# Patient Record
Sex: Female | Born: 1950 | Race: White | Hispanic: No | Smoking: Never smoker
Health system: Southern US, Academic
[De-identification: ages and names within clinical notes are randomized; demographics above are authoritative.]

## PROBLEM LIST (undated history)

## (undated) DIAGNOSIS — R413 Other amnesia: Secondary | ICD-10-CM

## (undated) DIAGNOSIS — B009 Herpesviral infection, unspecified: Secondary | ICD-10-CM

## (undated) DIAGNOSIS — K219 Gastro-esophageal reflux disease without esophagitis: Secondary | ICD-10-CM

## (undated) DIAGNOSIS — G2581 Restless legs syndrome: Secondary | ICD-10-CM

## (undated) DIAGNOSIS — E559 Vitamin D deficiency, unspecified: Secondary | ICD-10-CM

## (undated) DIAGNOSIS — M810 Age-related osteoporosis without current pathological fracture: Secondary | ICD-10-CM

## (undated) DIAGNOSIS — I10 Essential (primary) hypertension: Secondary | ICD-10-CM

## (undated) DIAGNOSIS — E782 Mixed hyperlipidemia: Secondary | ICD-10-CM

## (undated) HISTORY — DX: Restless legs syndrome: G25.81

## (undated) HISTORY — DX: Gastro-esophageal reflux disease without esophagitis: K21.9

## (undated) HISTORY — DX: Mixed hyperlipidemia: E78.2

## (undated) HISTORY — DX: Herpesviral infection, unspecified: B00.9

## (undated) HISTORY — DX: Age-related osteoporosis without current pathological fracture: M81.0

## (undated) HISTORY — DX: Essential (primary) hypertension: I10

## (undated) HISTORY — DX: Other amnesia: R41.3

## (undated) HISTORY — DX: Vitamin D deficiency, unspecified: E55.9

---

## 1998-02-07 ENCOUNTER — Other Ambulatory Visit (HOSPITAL_COMMUNITY): Payer: Self-pay

## 2008-06-07 IMAGING — MG MAMMO SCREEN W CAD
1 series · 4 of 4 positions shown · non-contrast
Comparison: 06/03/07 and 09/02/05.

Bilateral Digital Screening Mammography with Computer Assisted Diagnosis:
HISTORY: Asymptomatic 57-year-old with no family history of breast cancer in first degree relatives.

[Series 2: R CC · right · 4 of 4 slices shown]
[im 1/4]
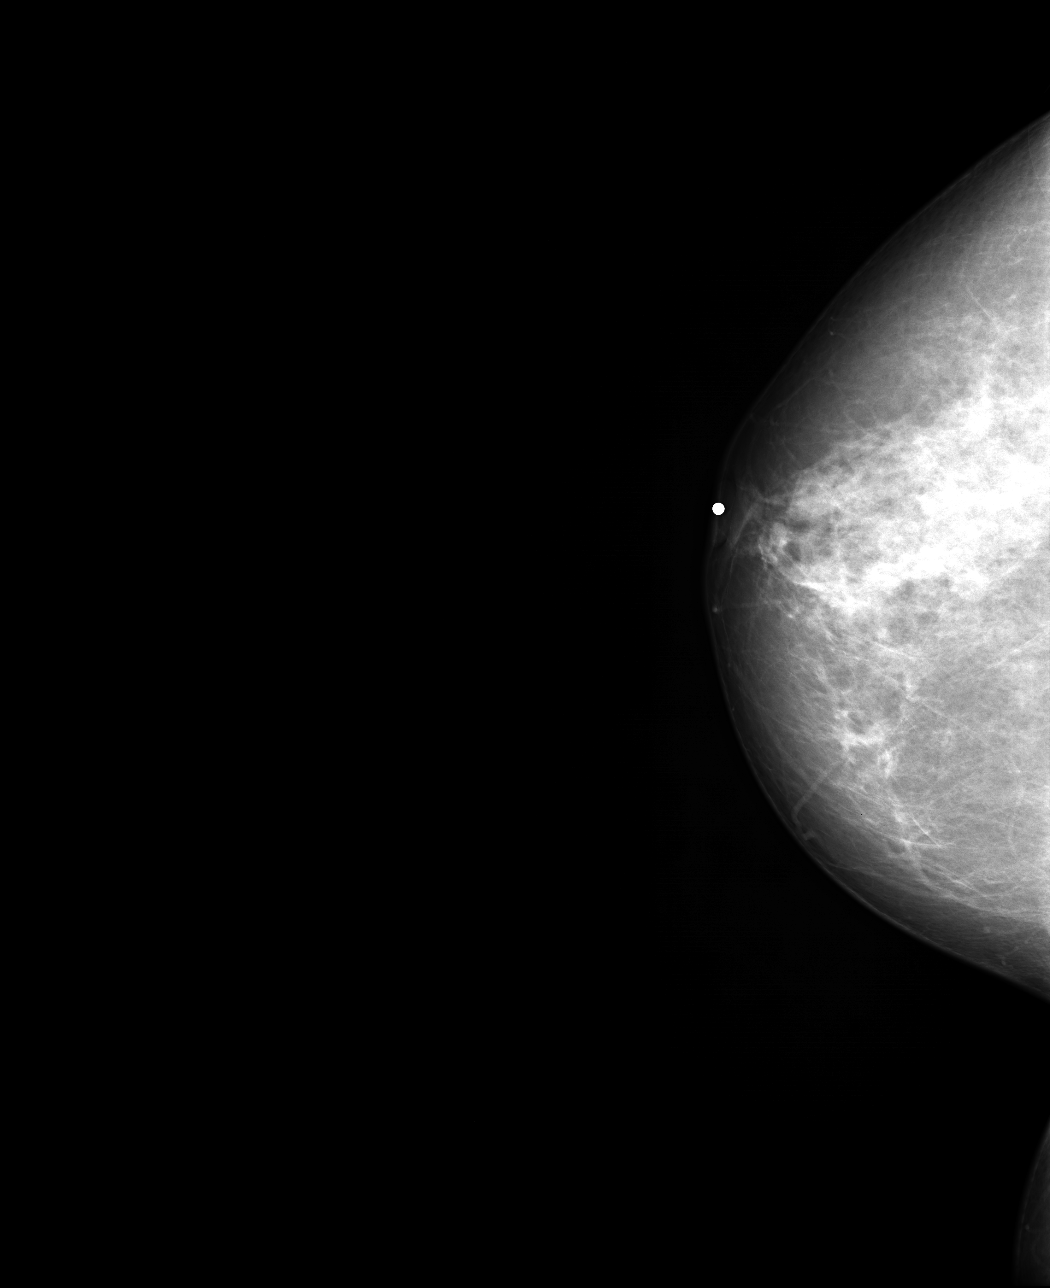
[im 2/4]
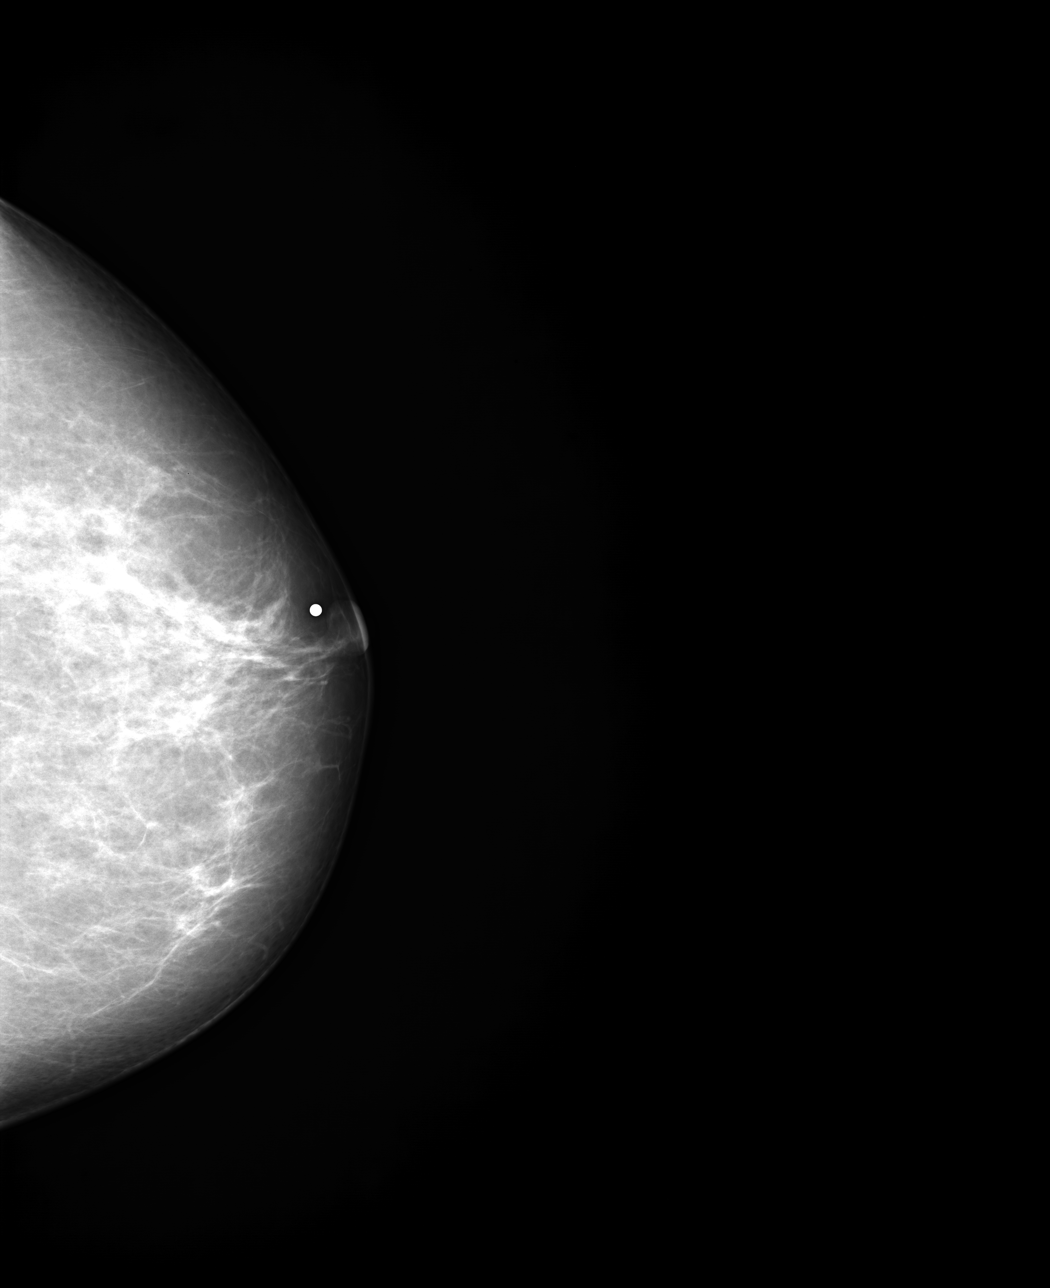
[im 3/4]
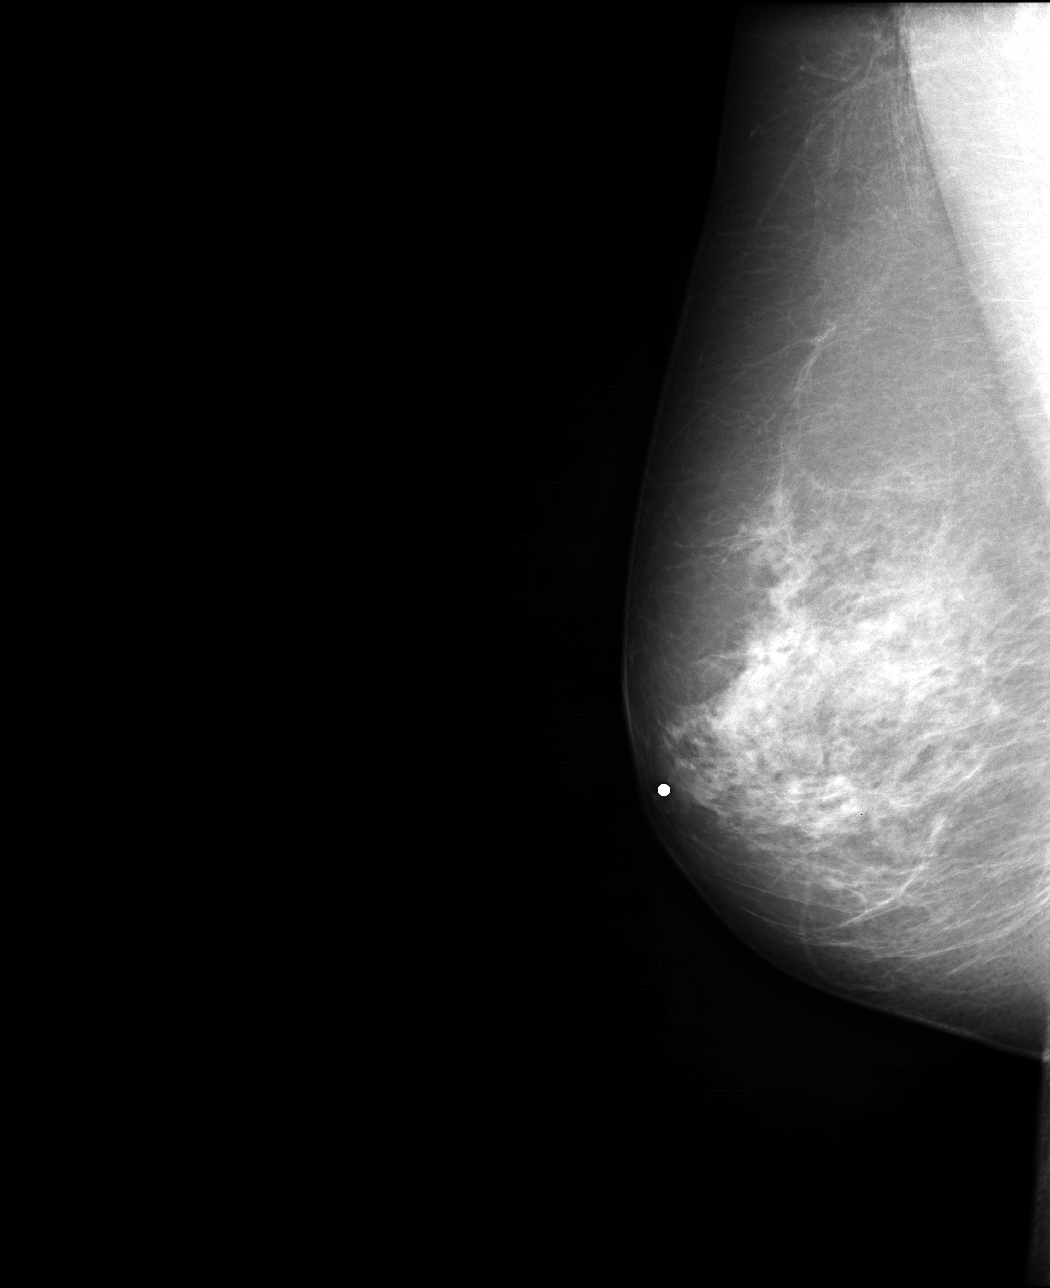
[im 4/4]
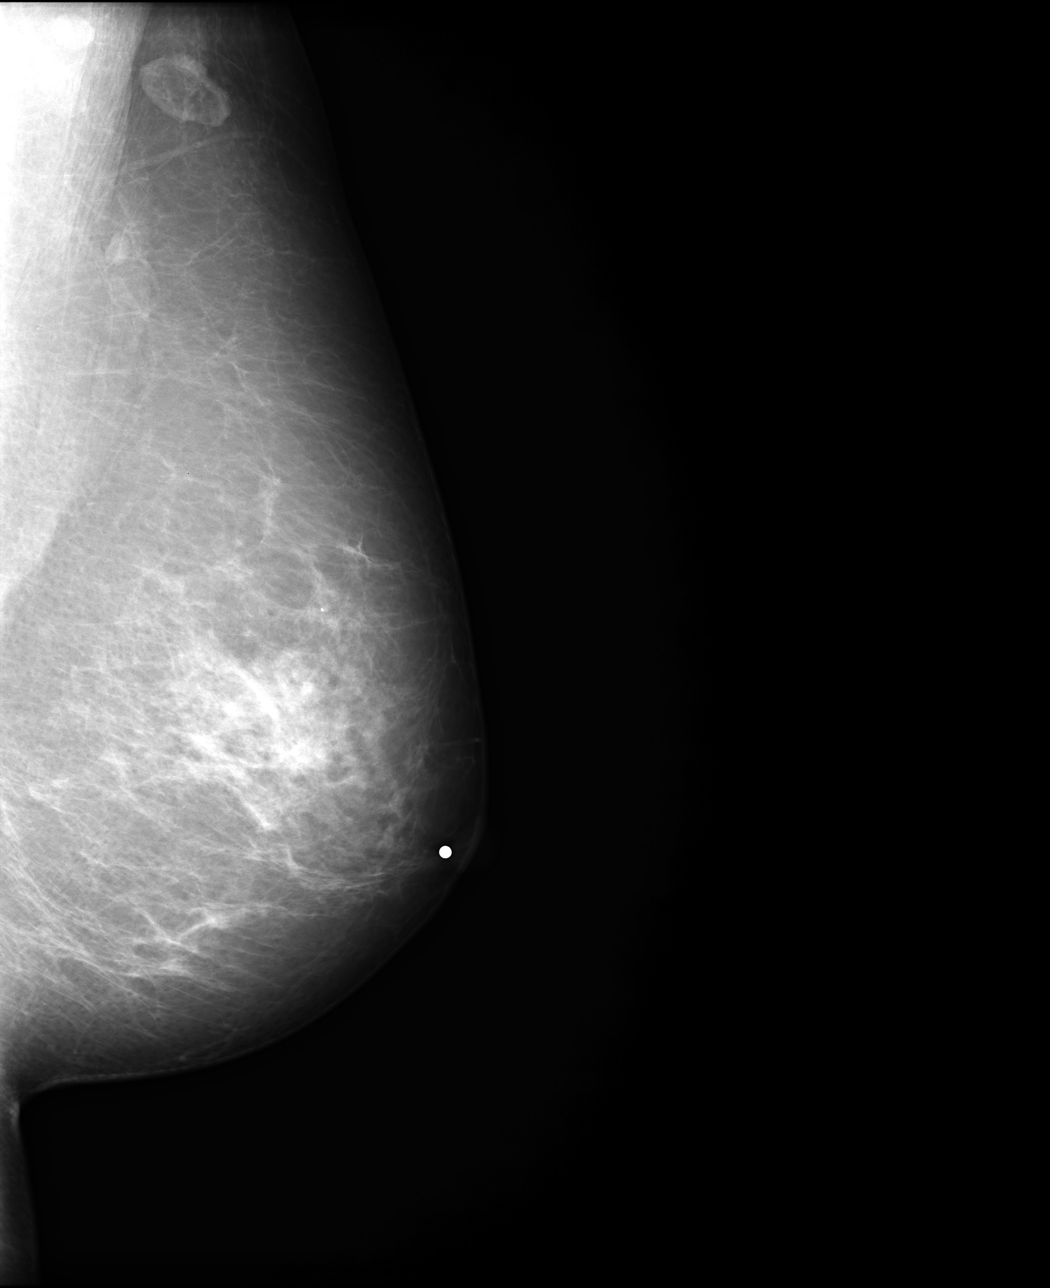

[4 of 4 positions shown; findings below may reference images not displayed]

FINDINGS: Bilaterally dense breasts limits sensitivity to the mammogram.  Mammographic findings, nevertheless are stable without masses or architectural changes.  Benign appearing lymph node in the axillary fatty hilum is noted on the left side.

NOTE:
In compliance with Federal regulations, the results of this mammogram are being sent to the patient.
IMPRESSION: Dense breasts limit sensitivity to the mammogram.  Mammographic findings are stable, however, from 06/03/07.  Please correlate with clinical breast exam findings.  Clinical follow-up and mammographic followup at 12 months are recommended.  

Final Assessment Code:  BI-RADS 2:

BI-RADS 0
Need additional imaging evaluation

BI-RADS 1
Negative mammogram

BI-RADS 2
Benign finding

BI-RADS 3
Probably benign finding - short interval follow-up suggested

BI-RADS 4
Suspicious abnormality: biopsy should be considered

BI-RADS 5
Highly suggestive of malignancy; appropriate action should be taken

________________________________

## 2008-06-09 IMAGING — US GYN
1 series · 14 of 16 positions shown · non-contrast
Comparison: none

HISTORY: Bilateral pelvic pain.

[Series 1: gyn · 0.33mm/px · 14 of 59 slices shown]
[im 1/59]
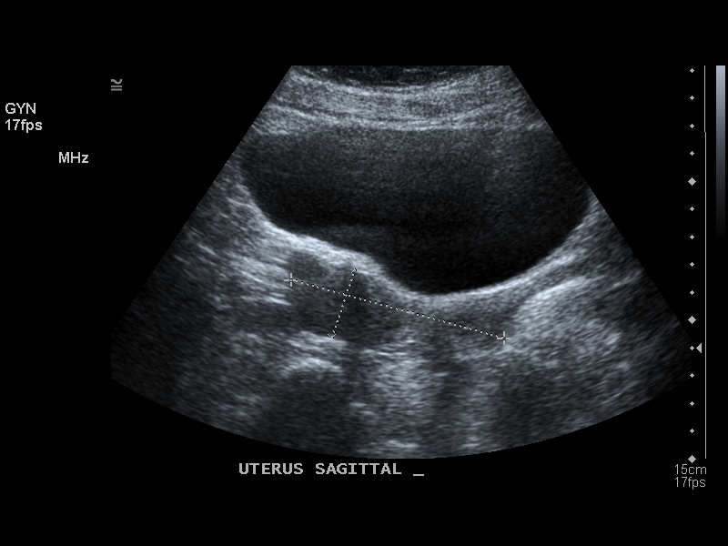
[im 4/59]
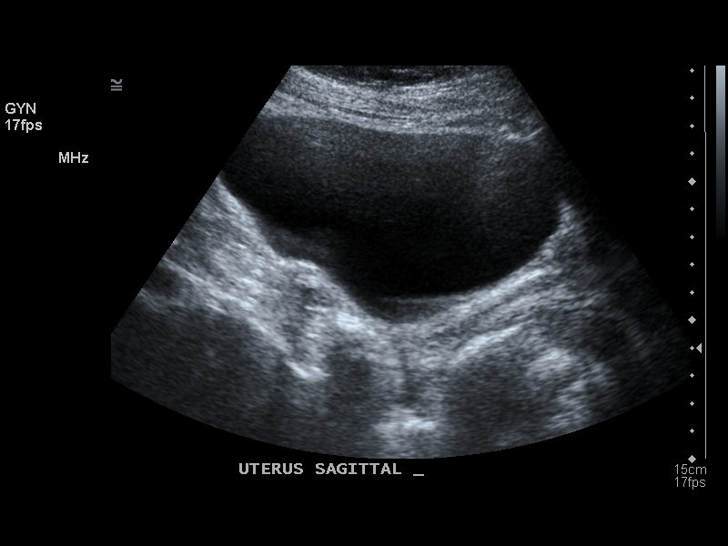
[im 8/59]
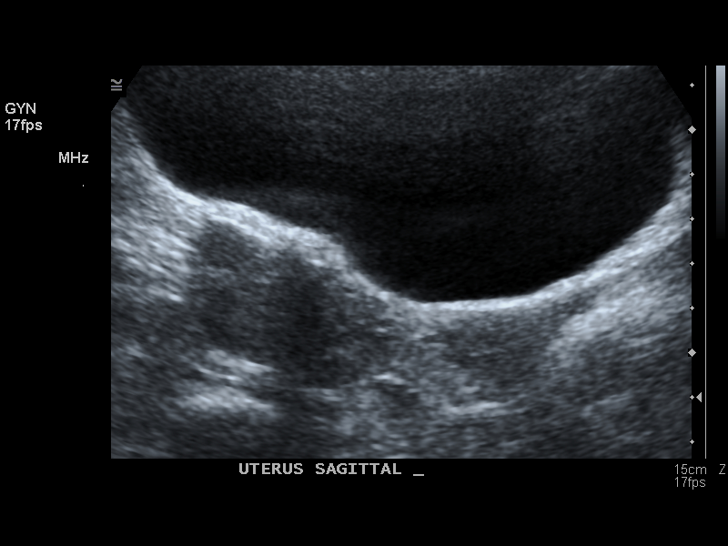
[im 16/59]
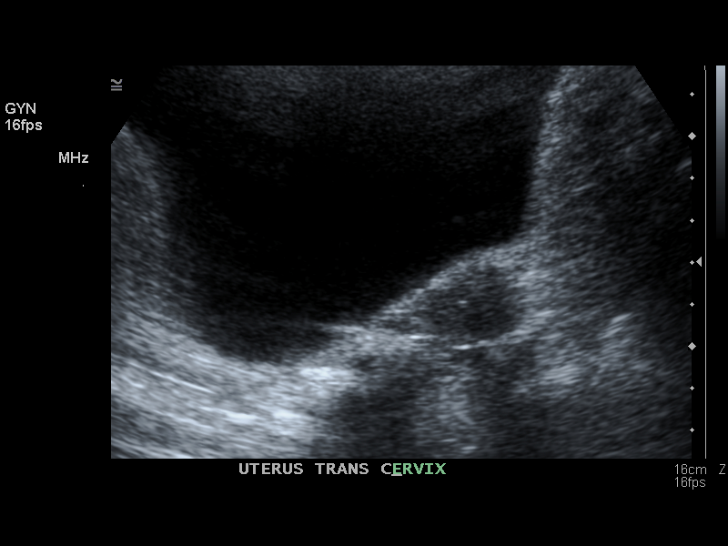
[im 20/59]
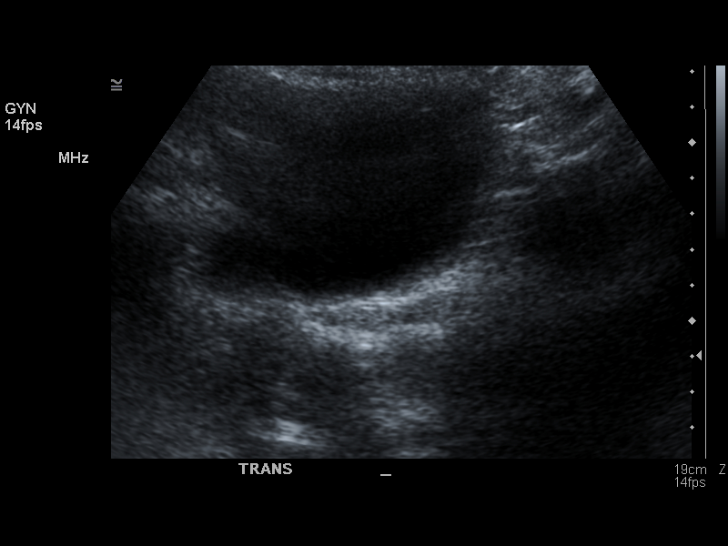
[im 24/59]
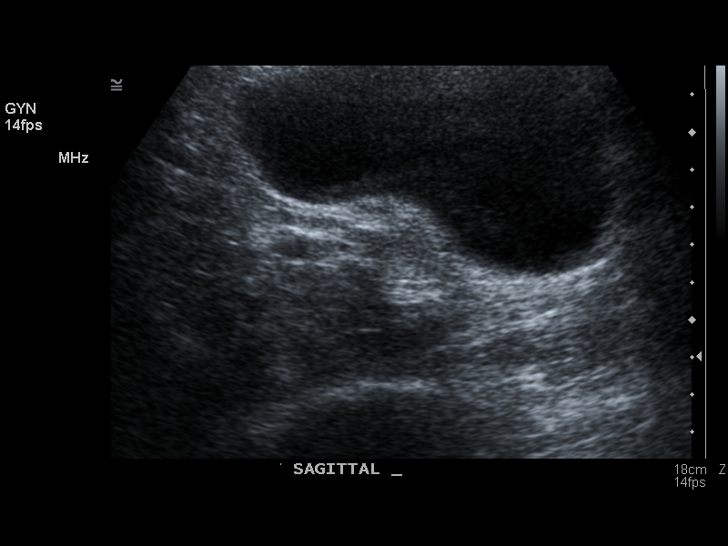
[im 28/59]
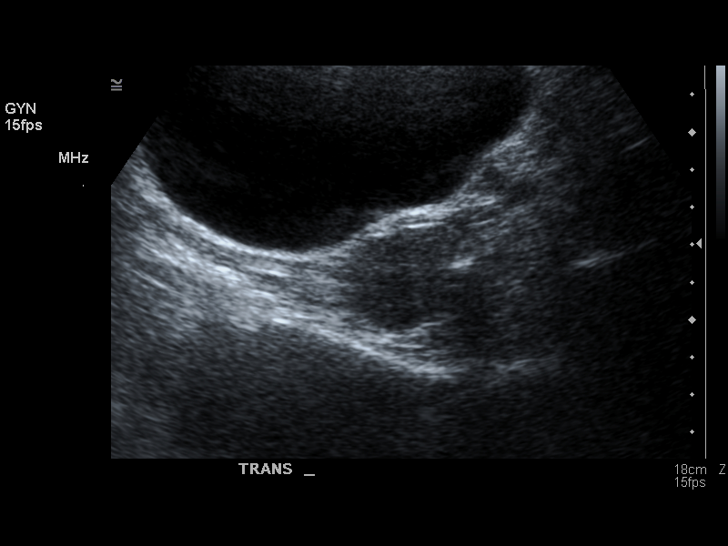
[im 31/59]
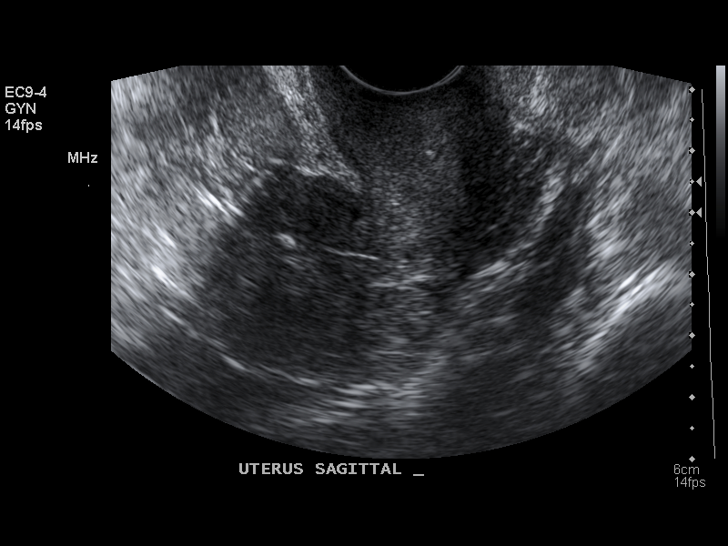
[im 35/59]
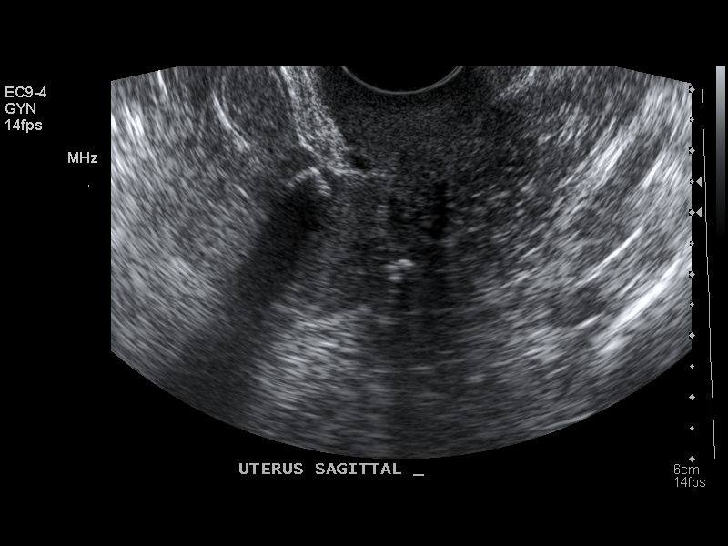
[im 39/59]
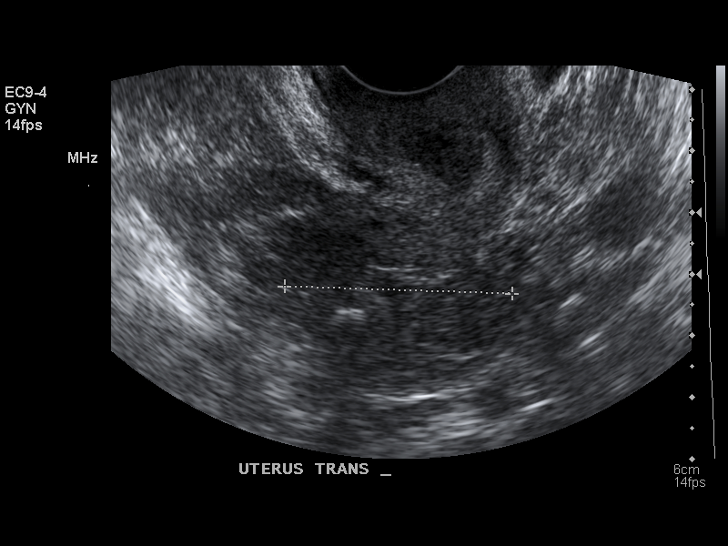
[im 47/59]
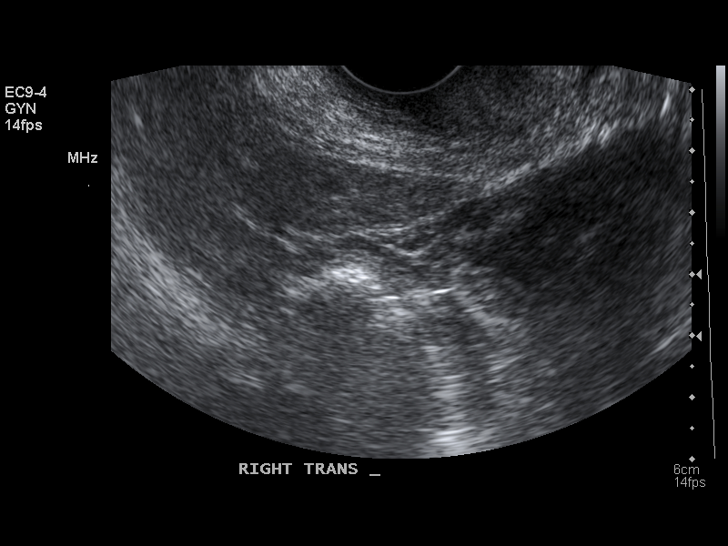
[im 51/59]
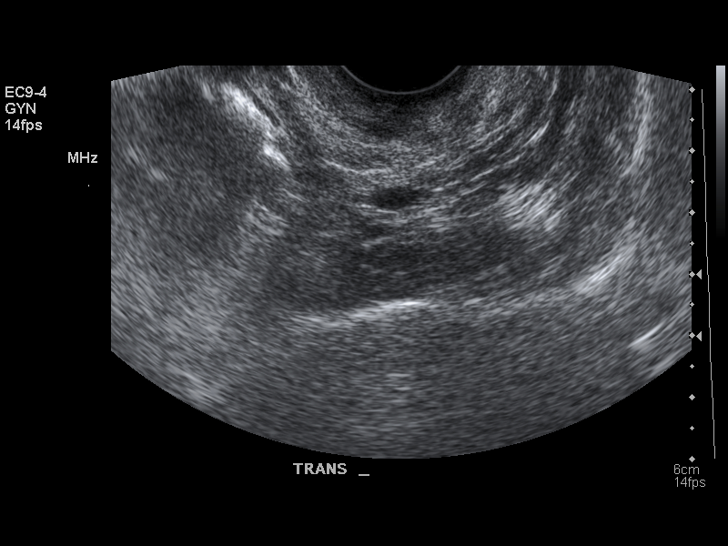
[im 55/59]
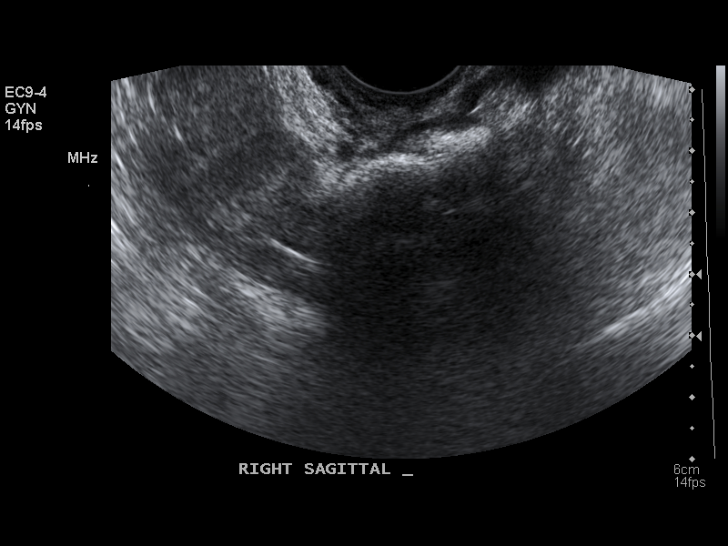
[im 59/59]
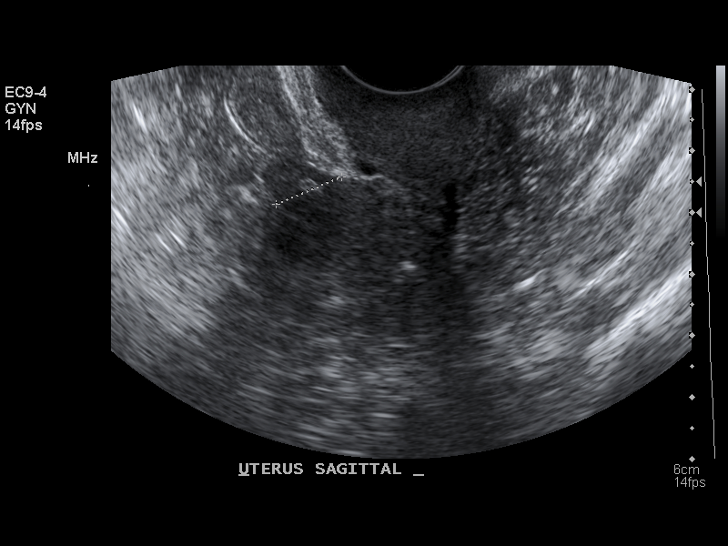

[14 of 16 positions shown; findings below may reference images not displayed]

FINDINGS: A transabdominal and transvaginal examination was performed.

The uterus appears normal in overall size.  There is a 2-cm fibroid in the uterine fundus.  The endometrium appears within normal limits.  Multiple small uterine calcifications are noted.

Neither ovary is well visualized.  There are no adnexal masses or free pelvic fluid.
IMPRESSION: Small uterine fibroid.

________________________________

## 2009-06-08 IMAGING — MG MAMMO SCREEN W CAD
1 series · 4 of 4 positions shown · non-contrast
Comparison: 06/03/07 and 06/07/08.

EXAM:
BILATERAL DIGITAL SCREENING MAMMOGRAM WITH COMPUTER ASSISTED DIAGNOSIS
HISTORY: Asymptomatic 58-year-old with no family history of breast cancer in first degree relatives.

[Series 2: R CC · right · 4 of 4 slices shown]
[im 1/4]
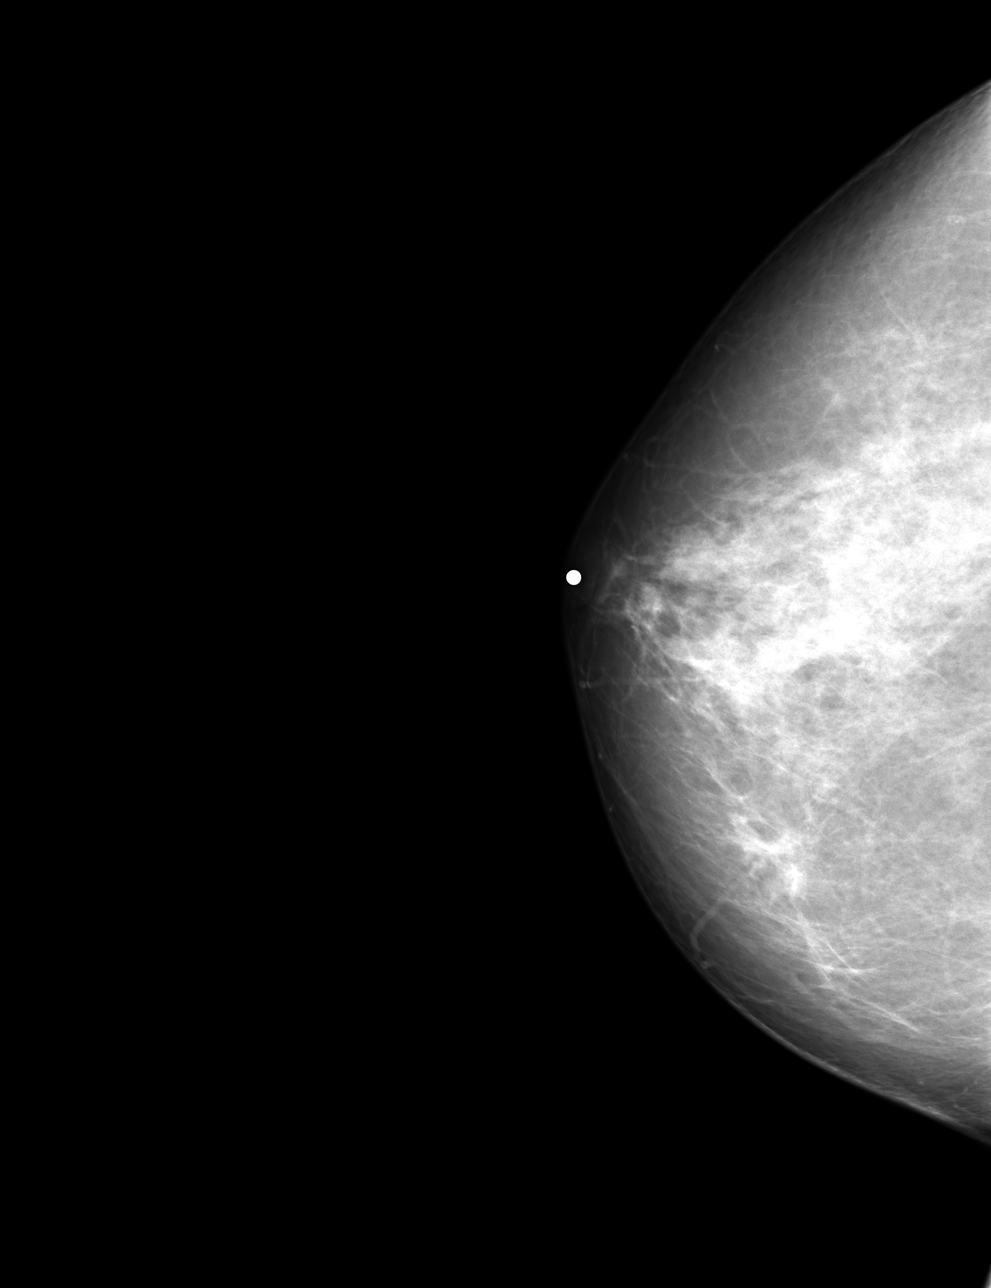
[im 2/4]
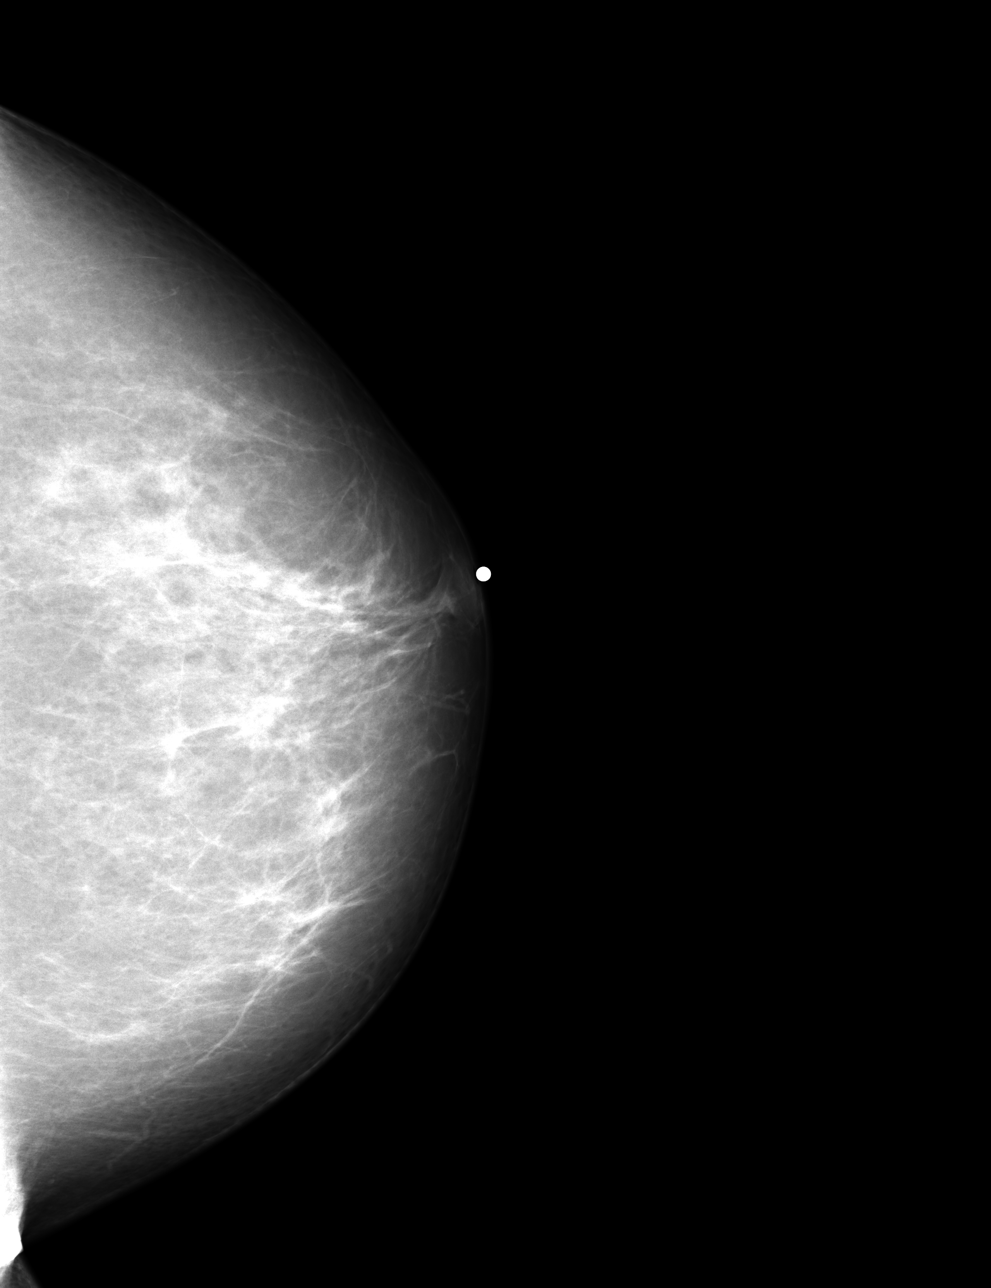
[im 3/4]
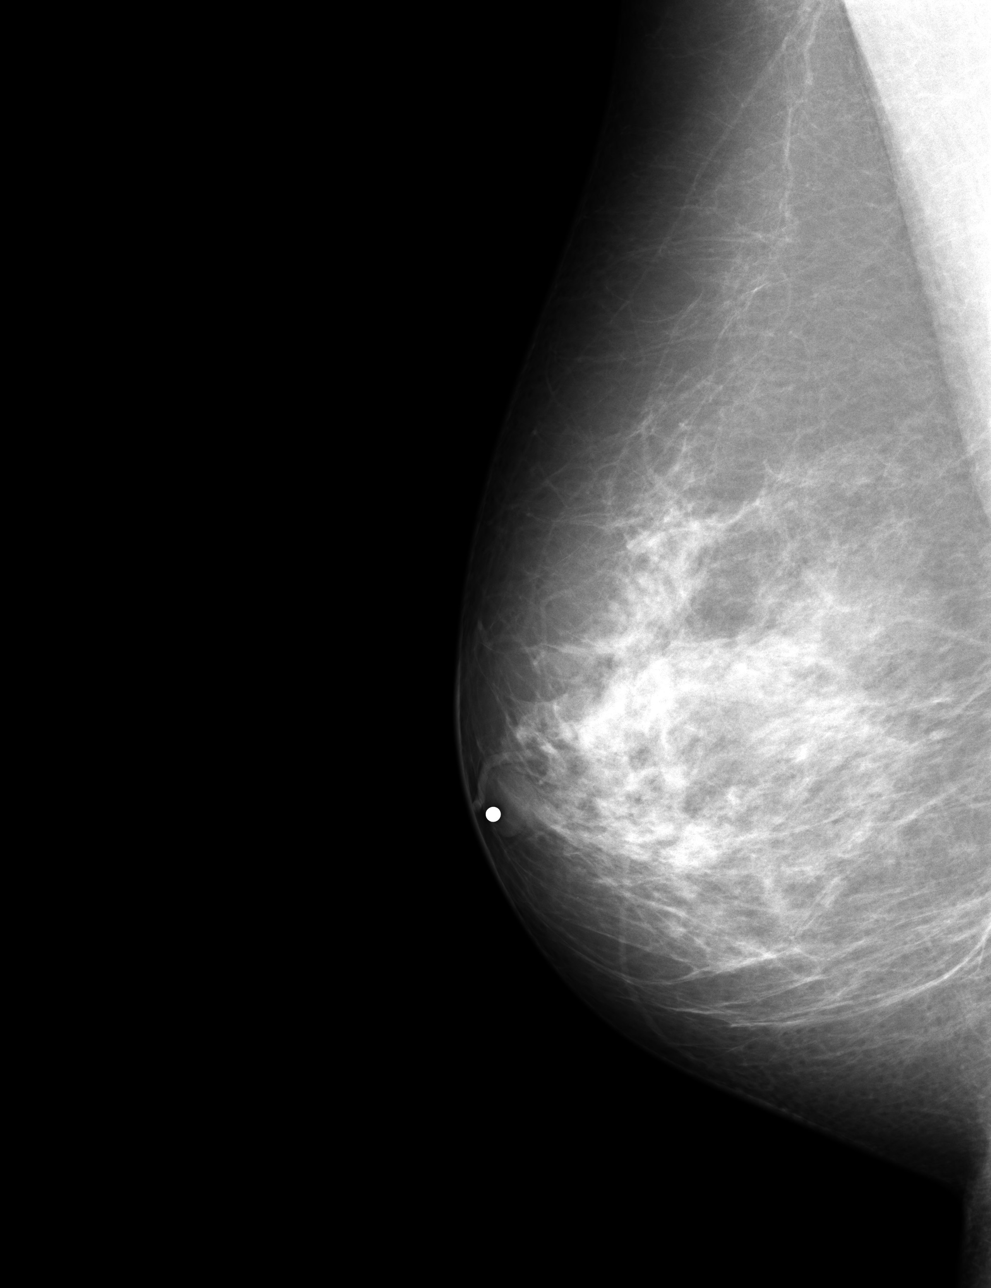
[im 4/4]
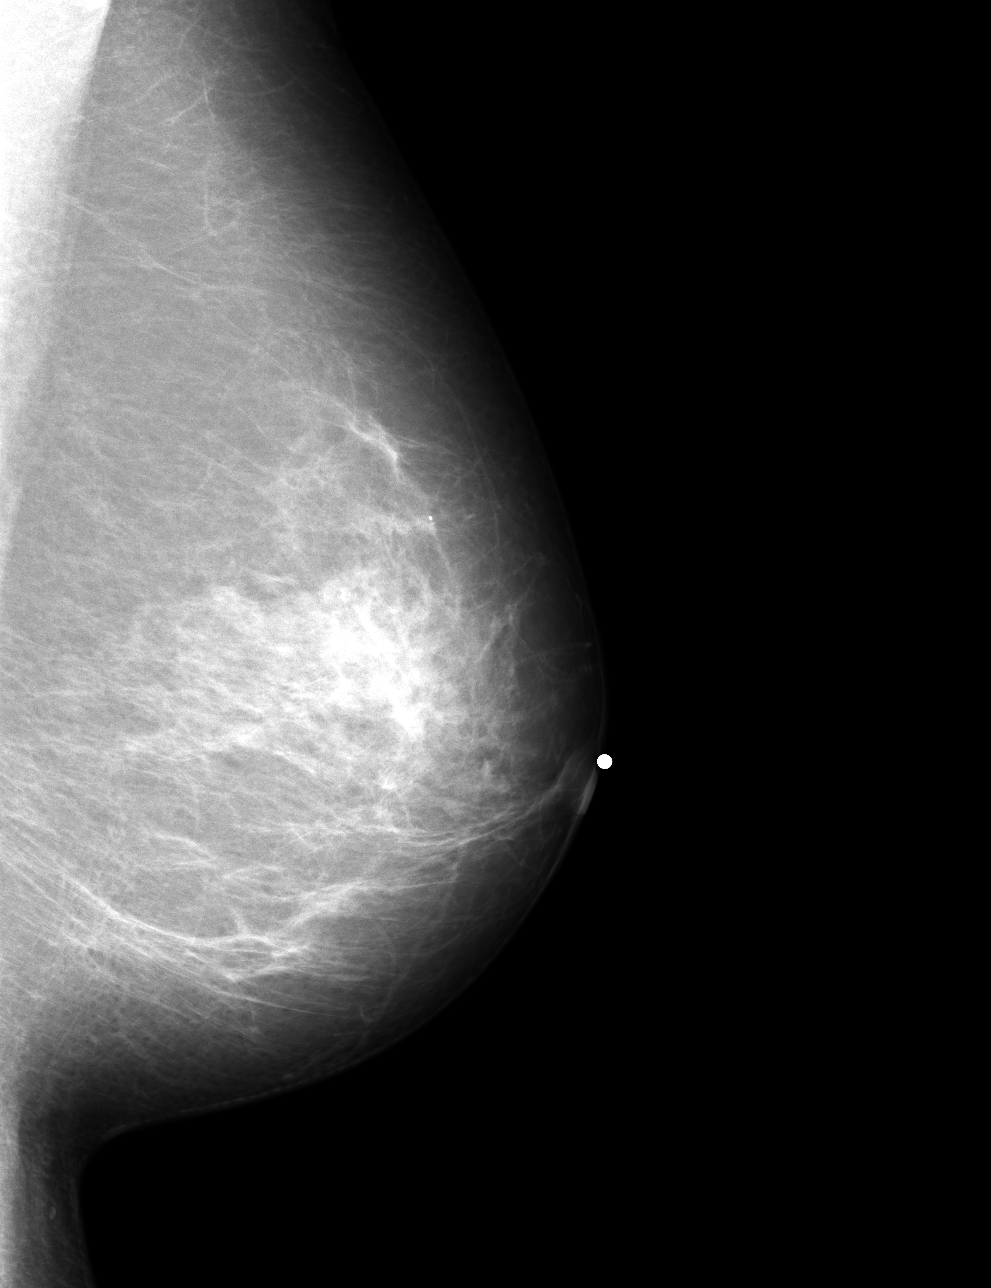

[4 of 4 positions shown; findings below may reference images not displayed]

FINDINGS: No well-defined masses or architectural changes are seen. Global asymmetry of the right breast compared to the left side is unchanged. No skin changes or nipple changes are seen 

NOTE:

In compliance with Federal regulations, the results of this mammogram are being sent to the patient.
IMPRESSION: Stable mammographic findings including global asymmetry of the right breast compared to the left side. Clinical and mammographic followup are recommended at twelve months. 

BI-RADS Category 2. 

BI-RADS 0
Need additional imaging evaluation
BI-RADS 1
Negative mammogram
BI-RADS 2
Benign finding
BI-RADS 3
Probably benign finding - short interval follow-up suggested
BI-RADS 4
Suspicious abnormality: biopsy should be considered
BI-RADS 5
Highly suggestive of malignancy; appropriate action should be taken

________________________________

## 2010-06-13 IMAGING — MG MAMMO SCREEN W CAD
1 series · 6 of 6 positions shown · non-contrast
Comparison: 04/30/11 and 04/29/10.
COMPARISON: 04/30/11 and 04/29/10.

------------- REPORT [id number] -------------
David Sebastian Lancha, do
INDICATION: Screening
ADDITIONAL HISTORY:
Patient denies any previous breast surgery and denies any current problems with her breasts. Patient denies any family history of breast cancer. 

NATIONAL CANCER RISK ASSESSMENT:
Patient’s five year risk of breast cancer according to the Anton Risk Model is 1.4% and lifetime risk of breast cancer is 8.7%. 
RISK FACTOR:
No positive family history of breast cancer.
TECHNIQUE: Bilateral MLO and CC digital images were obtained. This study was reviewed using iCAD 200 software.
Patient’s five year risk of breast cancer according to the Walter Eduardo Risk Model is 1.4% and lifetime risk of breast cancer is 8.7%. 

[Series 2: R CC · right · 6 of 6 slices shown]
[im 1/6]
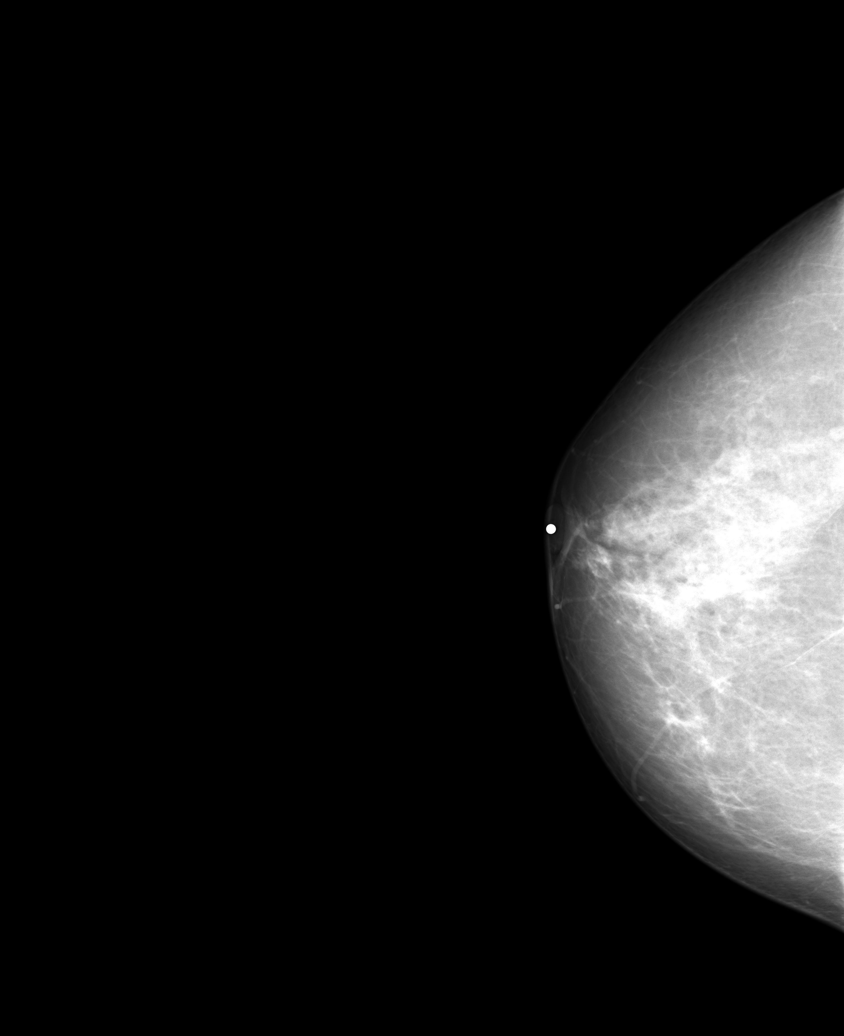
[im 2/6]
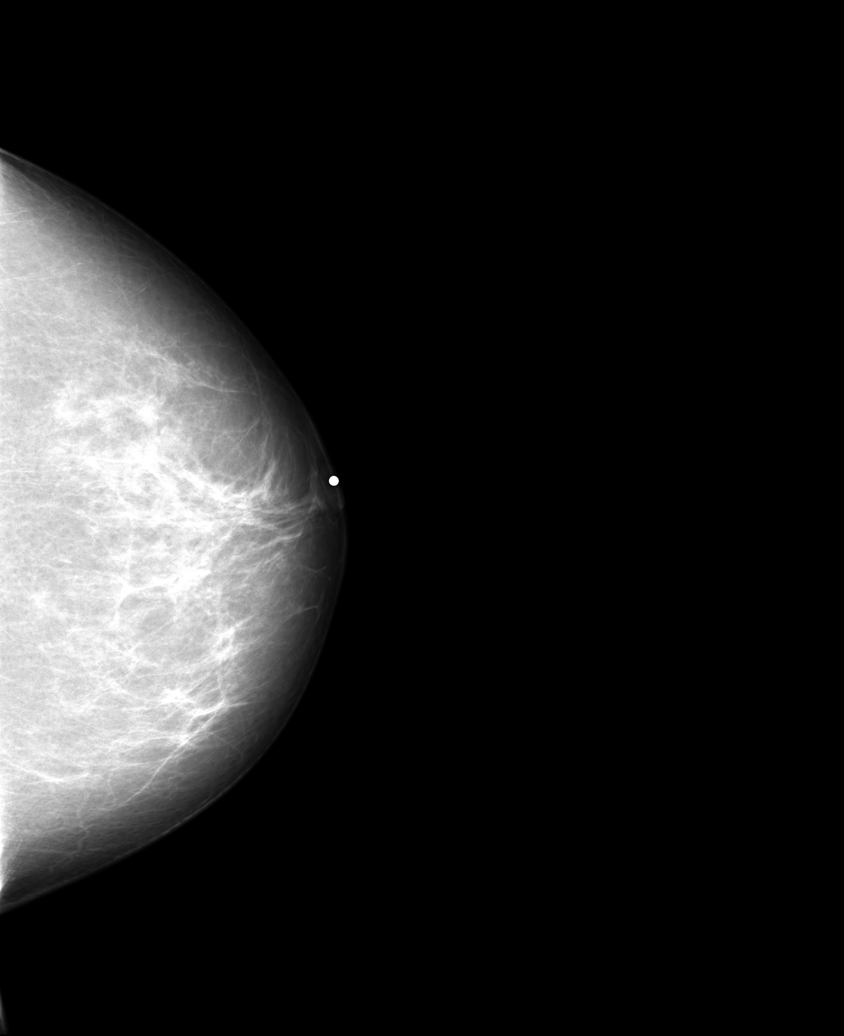
[im 3/6]
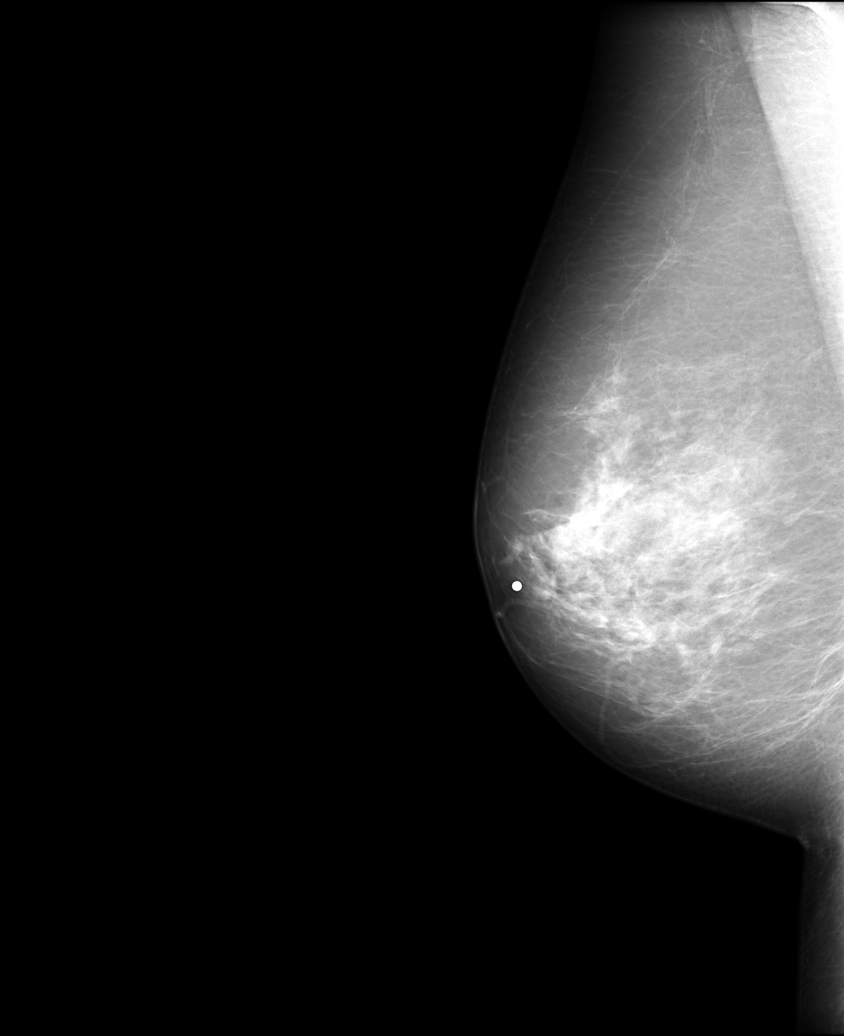
[im 4/6]
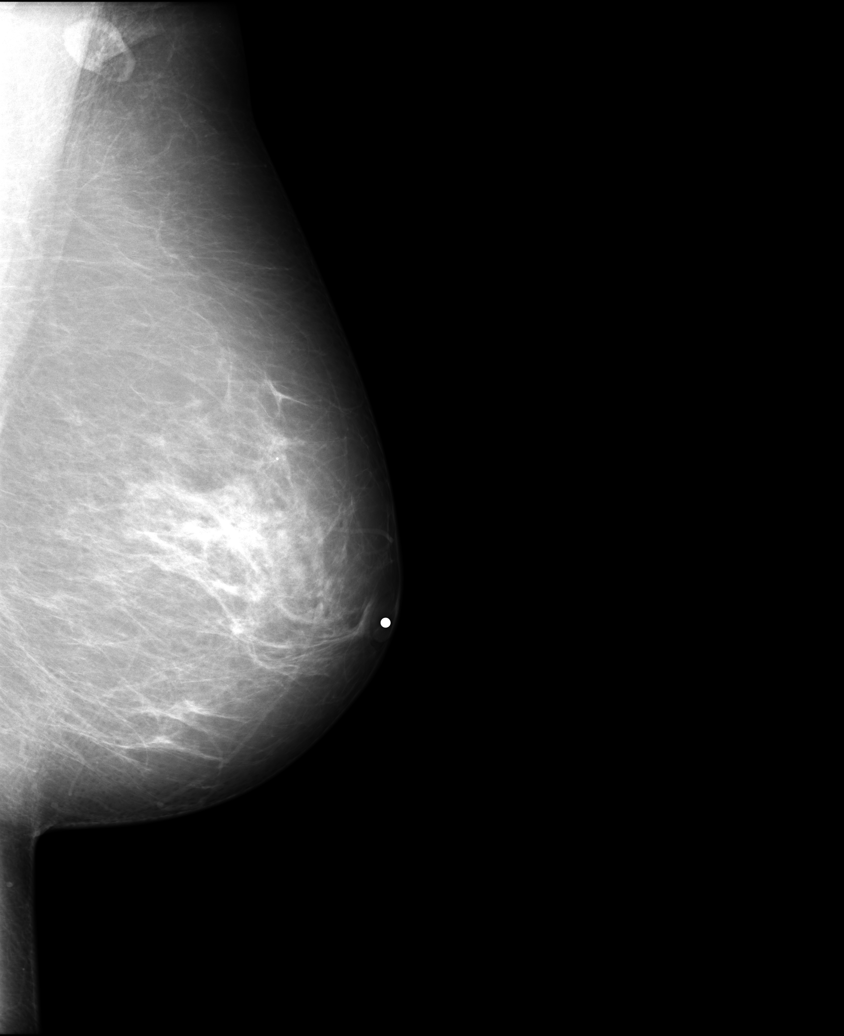
[im 5/6]
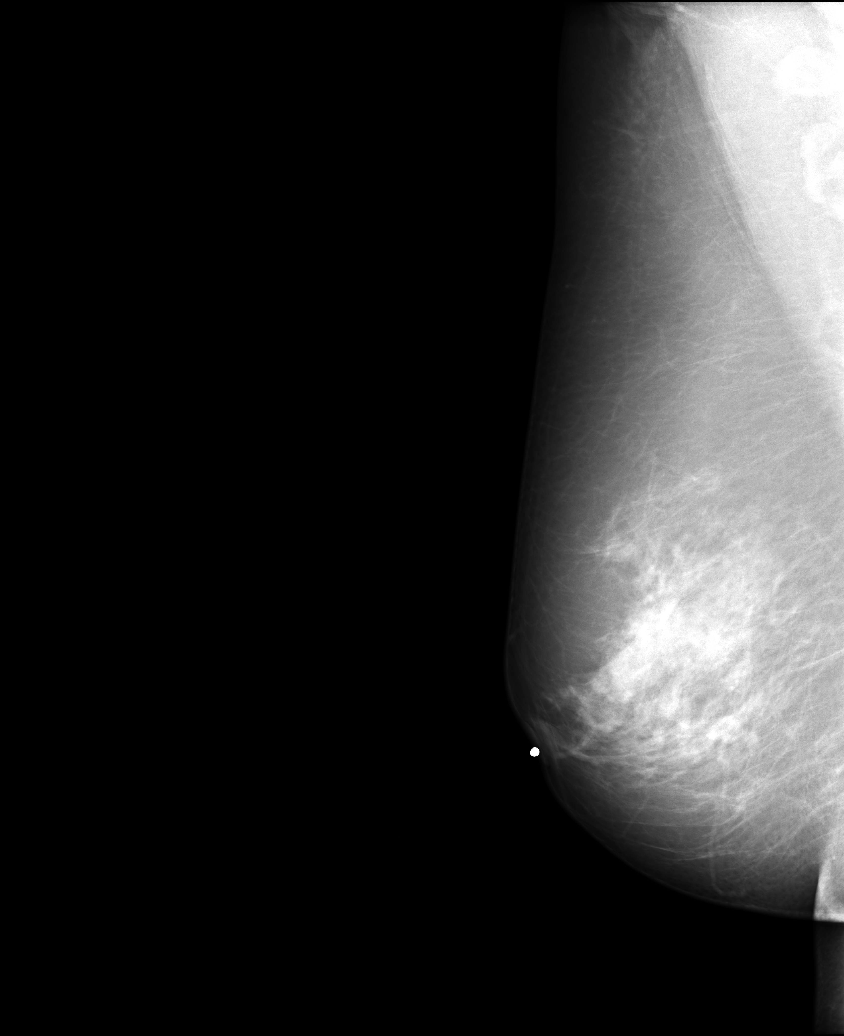
[im 6/6]
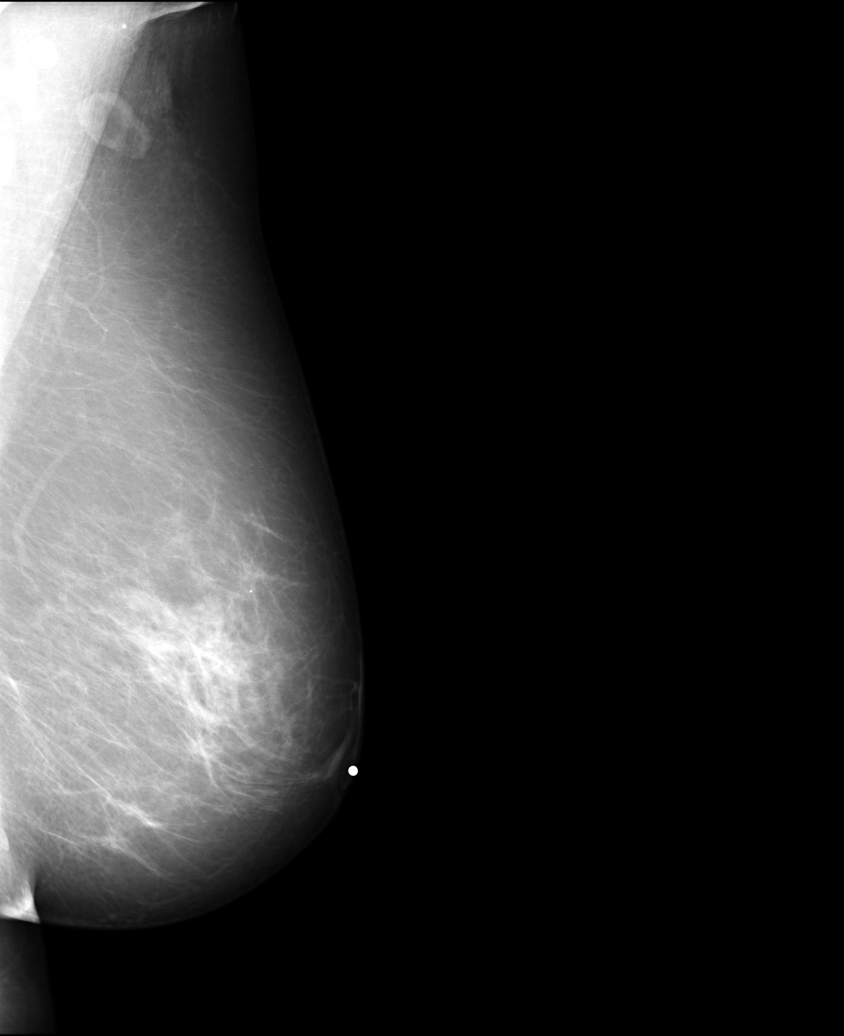

[6 of 6 positions shown; findings below may reference images not displayed]

FINDINGS: On the left CC view, there is an area of increased density on the lateral aspect. There are several punctate calcifications which appear to be present although they are quite faint and difficult to visualize. This area is not seen on previous mammograms.
IMPRESSION: BI-RADS 0

RECOMMENDATION: Left breast spot compression in the CC and MLO views. Left breast ultrasound of the upper outer quadrant is recommended as well. 

FINAL ASSESSMENT CODE:
BI-RADS 0

________________________________

------------- REPORT [id number] -------------
FINDINGS: On the left CC view, there is an area of increased density on the lateral aspect. There are several punctate calcifications which appear to be present although they are quite faint and difficult to visualize. This area is not seen on previous mammograms.
IMPRESSION: BI-RADS 0

RECOMMENDATION: Left breast spot compression in the CC and MLO views. Left breast ultrasound of the upper outer quadrant is recommended as well. 

FINAL ASSESSMENT CODE:
BI-RADS 0

________________________________

## 2010-06-19 IMAGING — MG MAMMO UNI LT DIAGNOSTIC W CAD
1 series · 2 of 2 positions shown · non-contrast
Comparison: 06/13/10 and 06/08/09 and 06/07/08.

Dig Mammo CB add views, Left Breast Ultrasound

Exam:
Digital Diagnostic Mammogram Left with CAD and Left Breast Ultrasound
INDICATION: Abnormal screening mammogram.

[Series 2: L CC · left · 2 of 2 slices shown]
[im 1/2]
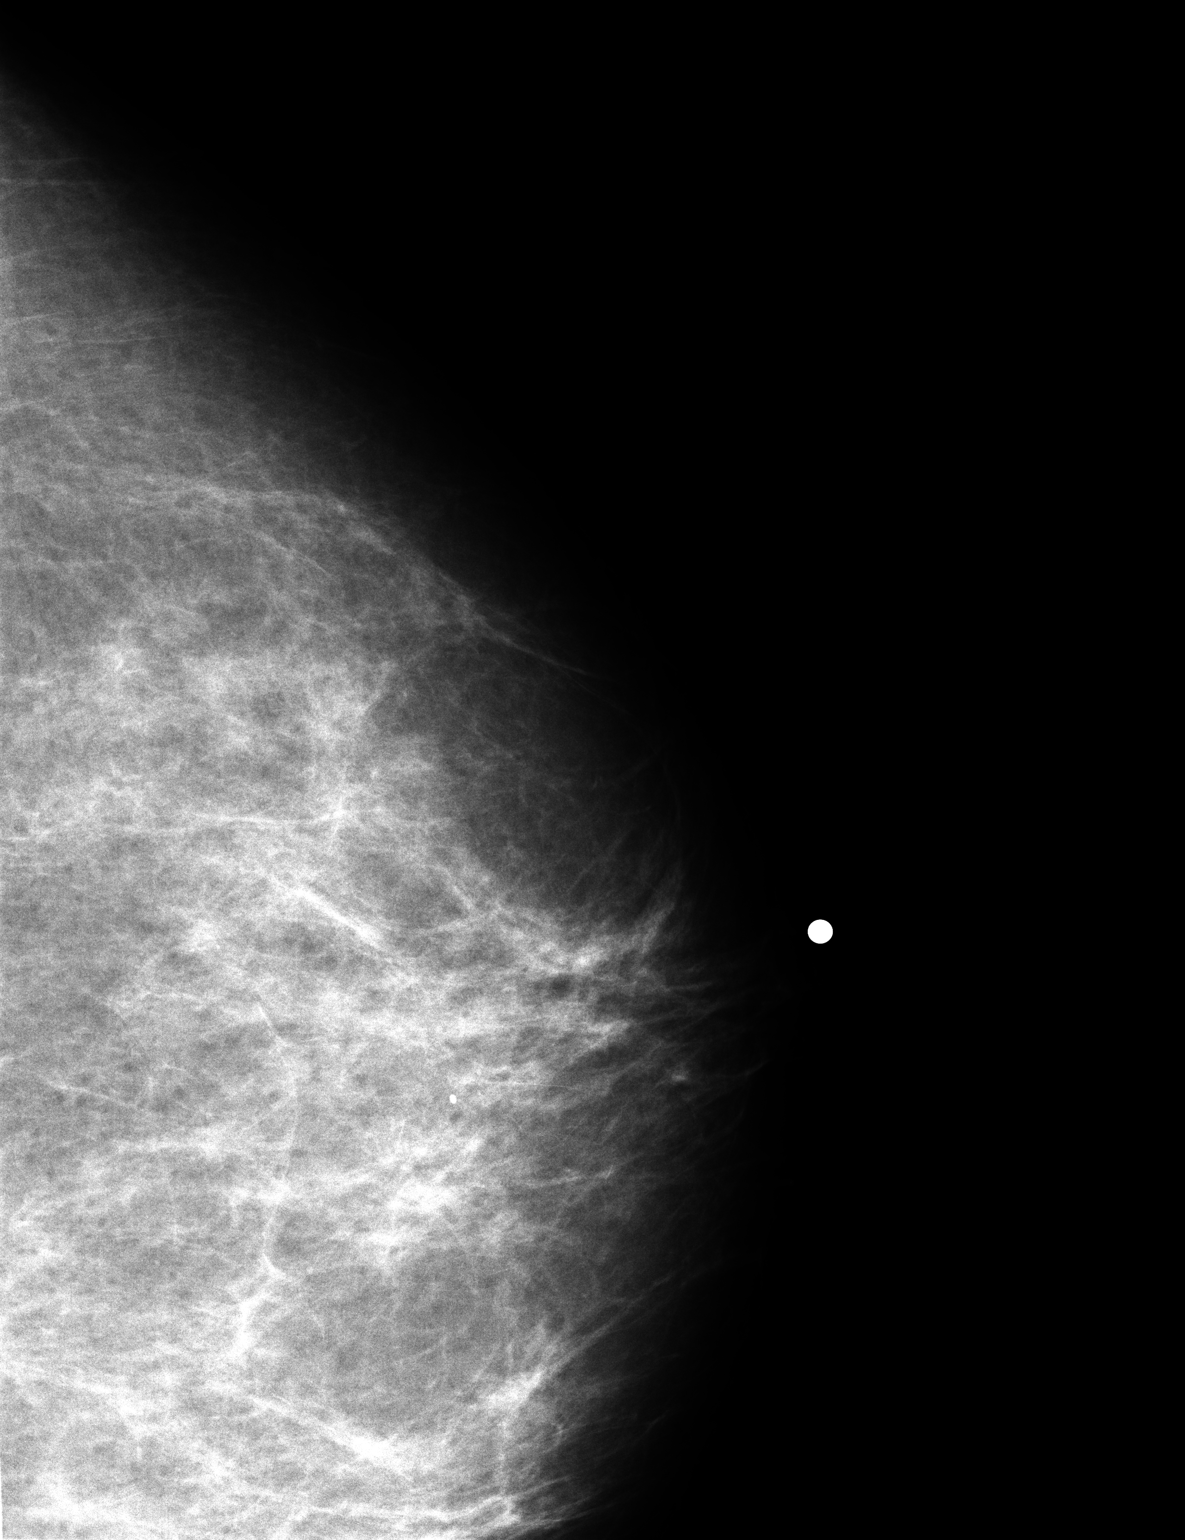
[im 2/2]
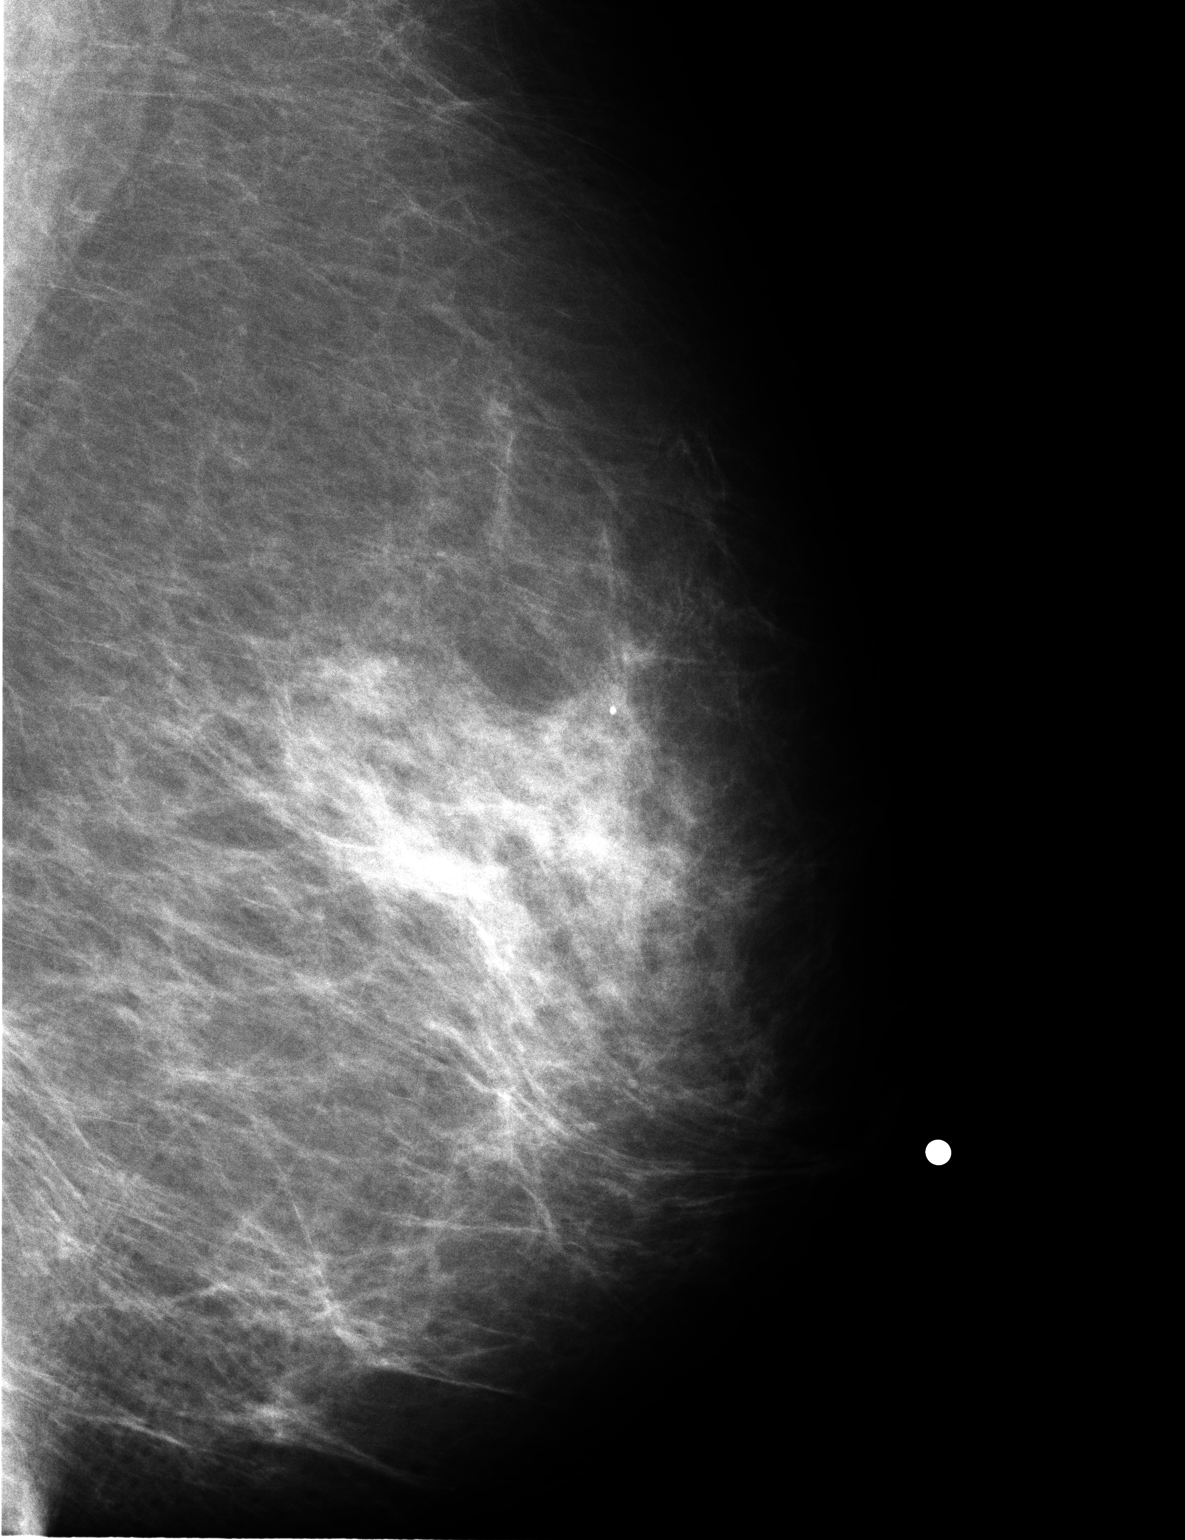

[2 of 2 positions shown; findings below may reference images not displayed]

FINDINGS: Spot compression magnification of the left breast demonstrates an area of question on the screening mammogram to efface in a normal fashion. No calcifications are appreciated at that location. Ultrasound imaging of the upper outer quadrant of the left breast is unremarkable. The axilla on the left demonstrates a normal appearing lymph node with fatty hilum. 

NOTE:

In compliance with Federal regulations, the results of this mammogram are being sent to the patient.
IMPRESSION: Bi-Rads 2. 
RECOMMENDATION: Annual screening per ACR guidelines and monthly self breast examination. 

Final Assessment Code:
Bi-Rads 2 

BI-RADS 0
Need additional imaging evaluation
BI-RADS 1
Negative mammogram
BI-RADS 2
Benign finding
BI-RADS 3
Probably benign finding: short-interval follow-up suggested
BI-RADS 4
Suspicious abnormality:  biopsy should be considered
BI-RADS 5
Highly suggestive of malignancy; appropriate action should be taken

________________________________

## 2011-06-20 IMAGING — MG MAMMO SCREEN W CAD
1 series · 4 of 4 positions shown · non-contrast
Comparison: 06/13/10 and 06/08/09.

INDICATION: Screening

ADDITIONAL HISTORY:
Patient denies previous breast surgery. She denies any current problems with her breasts. 
RISK FACTOR:
No positive family history of breast cancer.
TECHNIQUE: Bilateral MLO and CC digital images were obtained. This study was reviewed using iCAD 200 software.

[Series 2: R CC · right · 4 of 4 slices shown]
[im 1/4]
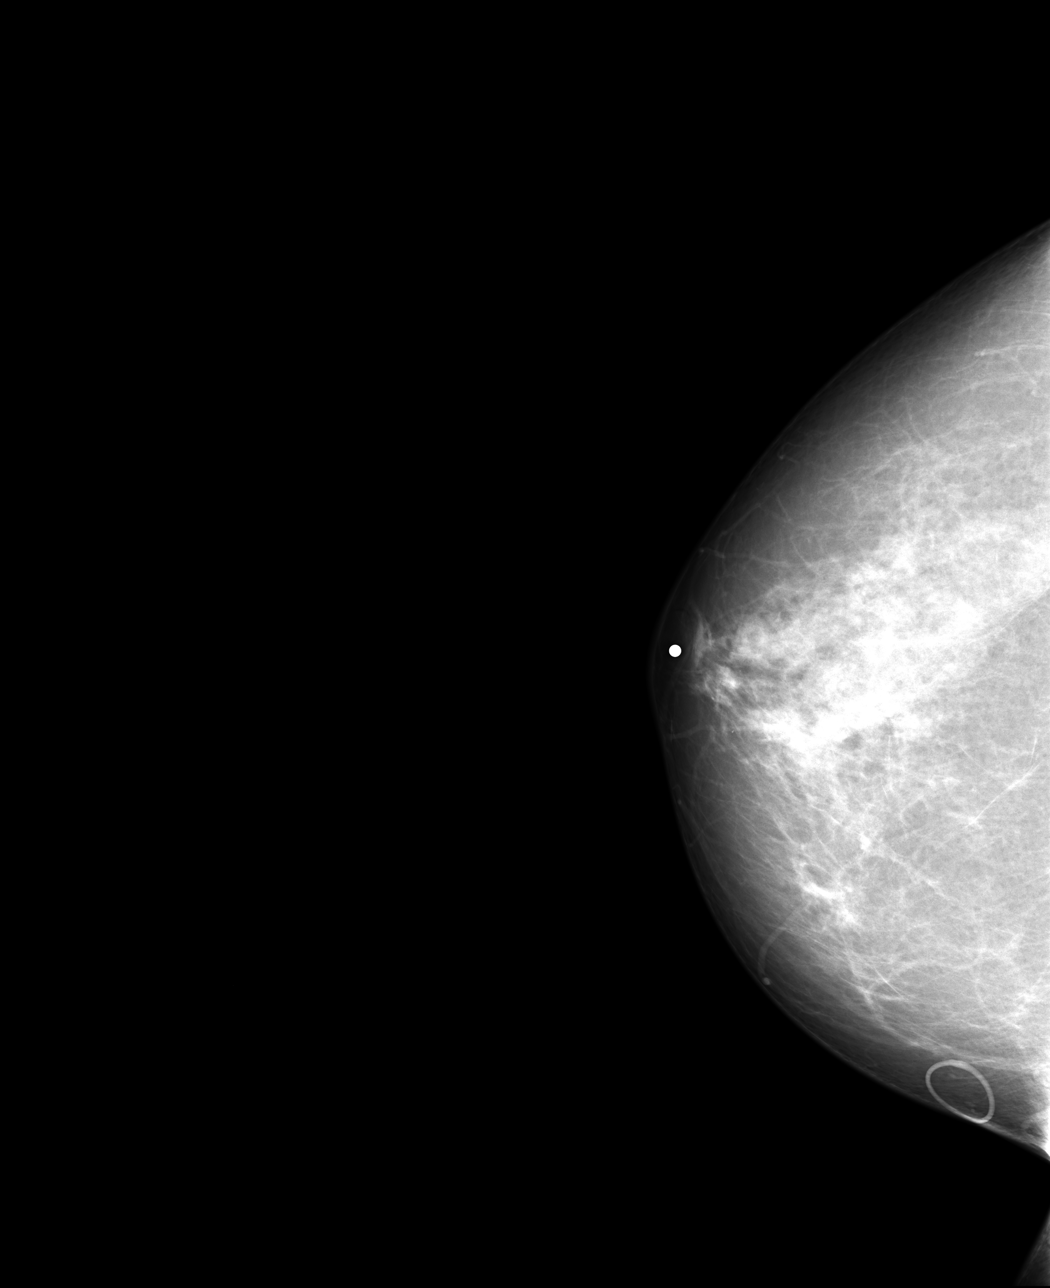
[im 2/4]
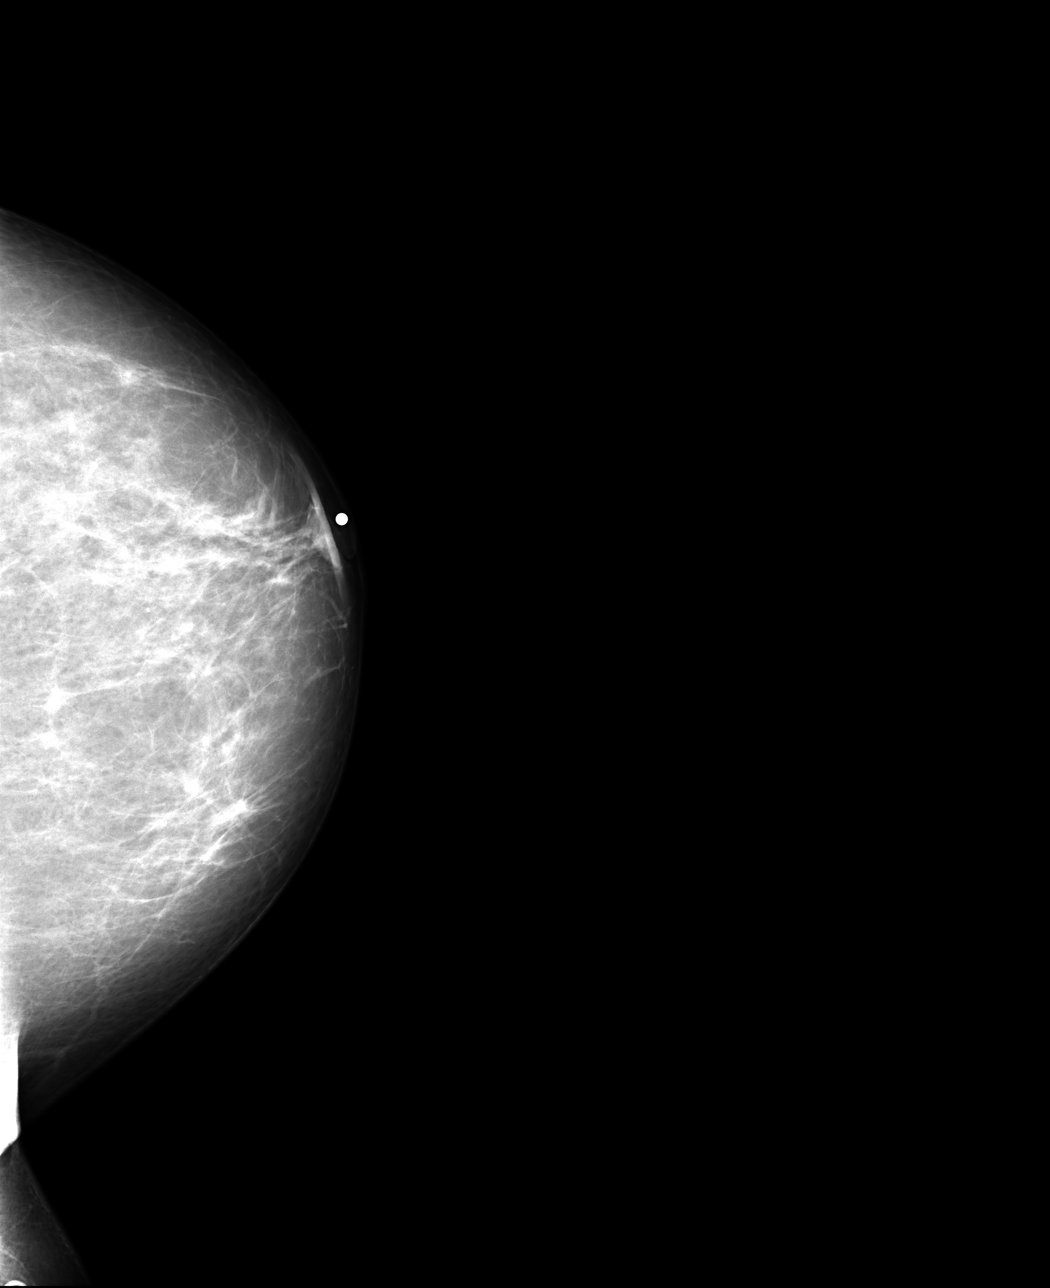
[im 3/4]
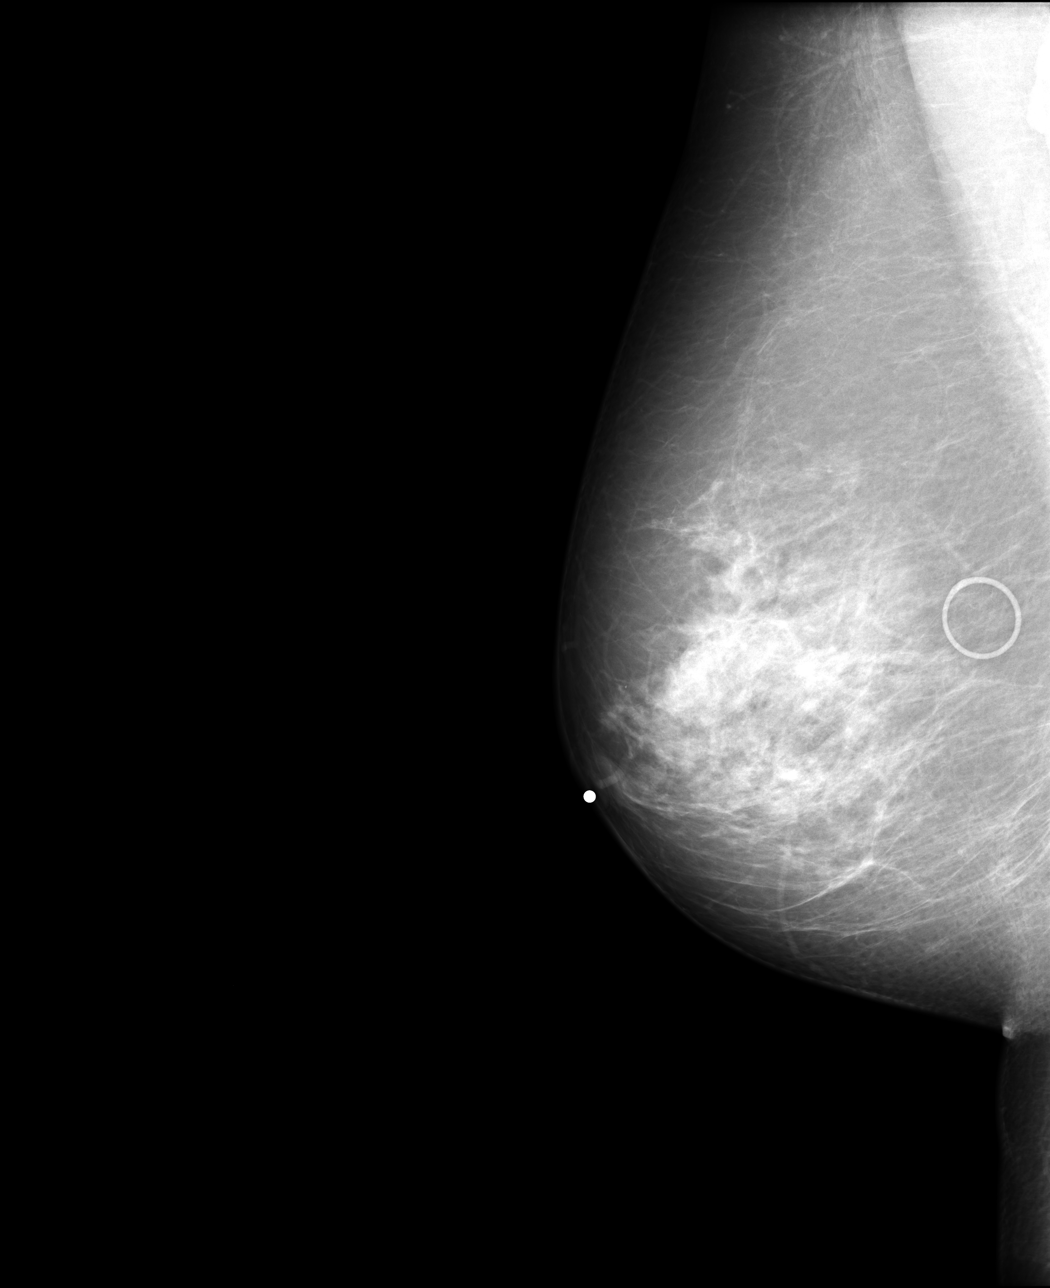
[im 4/4]
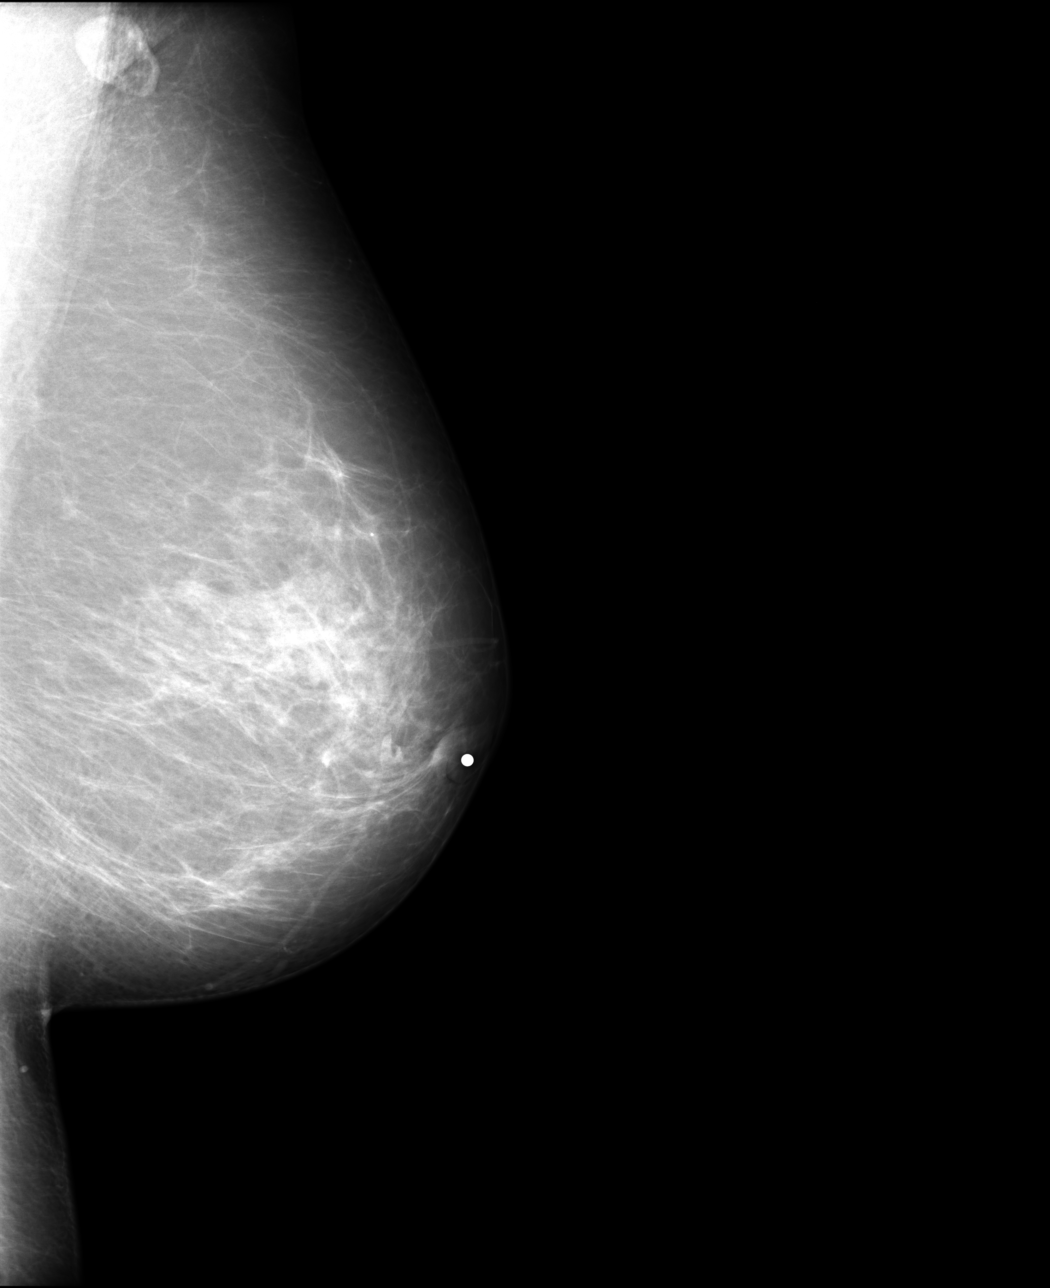

[4 of 4 positions shown; findings below may reference images not displayed]

FINDINGS: The breasts have scattered fibroglandular density. There is no skin thickening, or nipple retraction noted. No suspicious masses, microcalcifications or architectural distortion are identified.
IMPRESSION: BI-RADS 1

RECOMMENDATION: Annual screening per ACR guidelines and monthly self breast examination. 

FINAL ASSESSMENT CODE:

BI-RADS 1

________________________________

[DATE] [DATE]

## 2012-06-25 IMAGING — MG MAMMO SCREEN W CAD
1 series · 4 of 4 positions shown · non-contrast
Comparison: Mammography from June 2011.

Yashita Nonato, do

Exam:
Screening digital mammogram with CAD
INDICATION: Annual.

[Series 2: R CC · right · 4 of 4 slices shown]
[im 1/4]
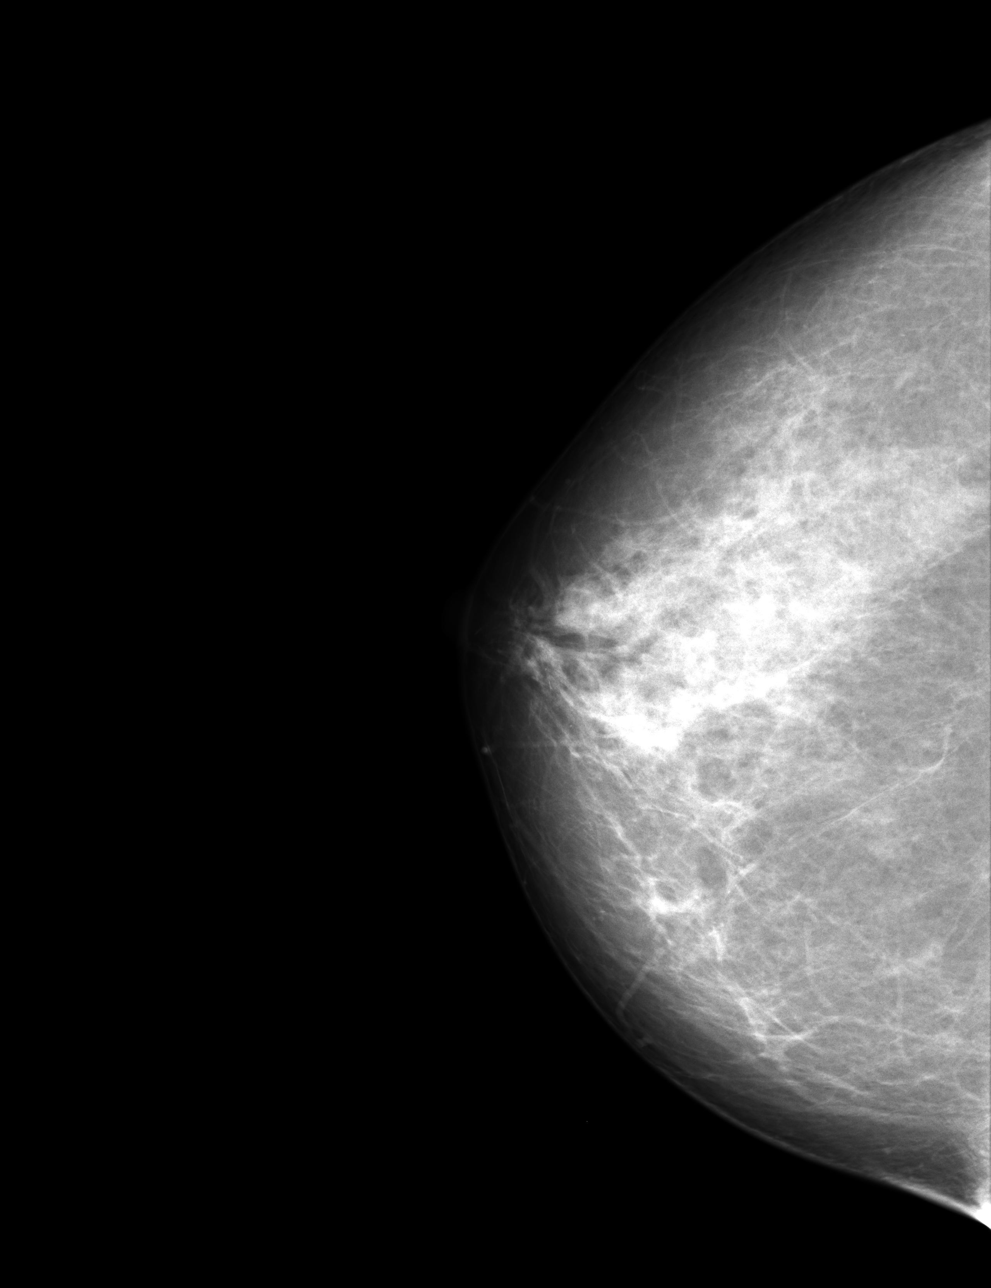
[im 2/4]
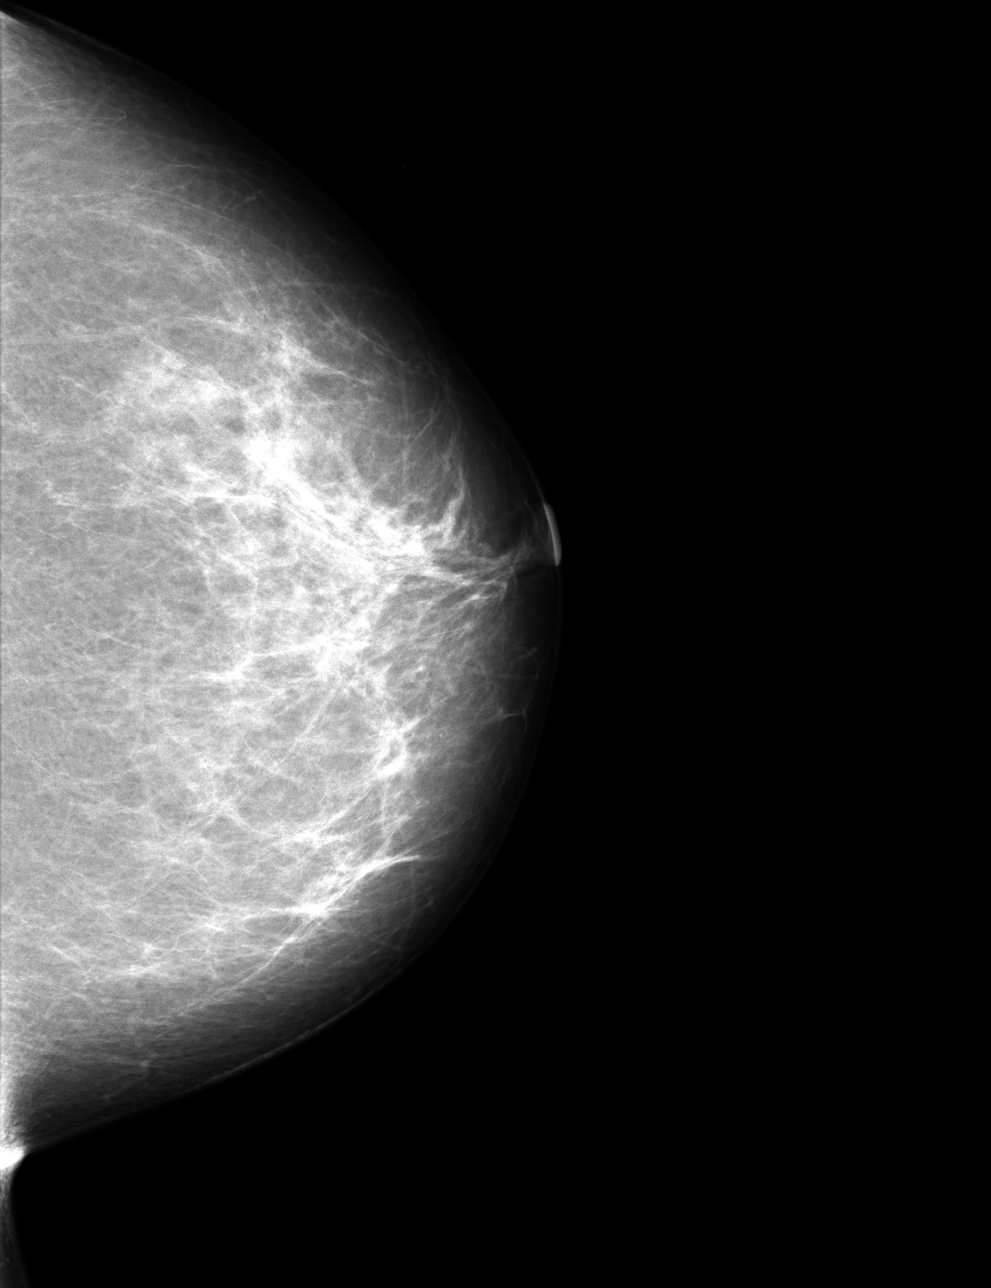
[im 3/4]
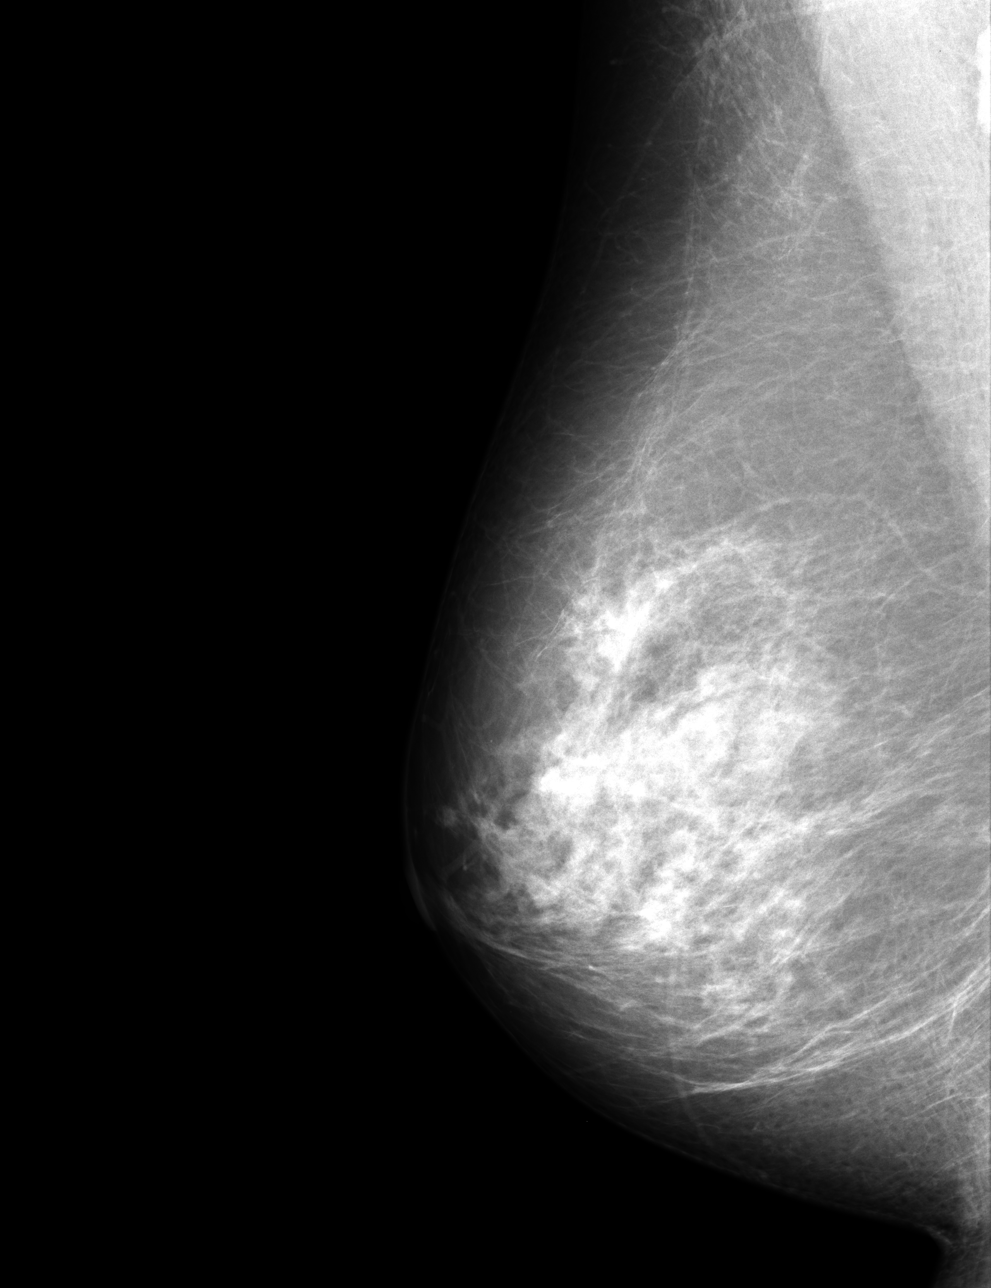
[im 4/4]
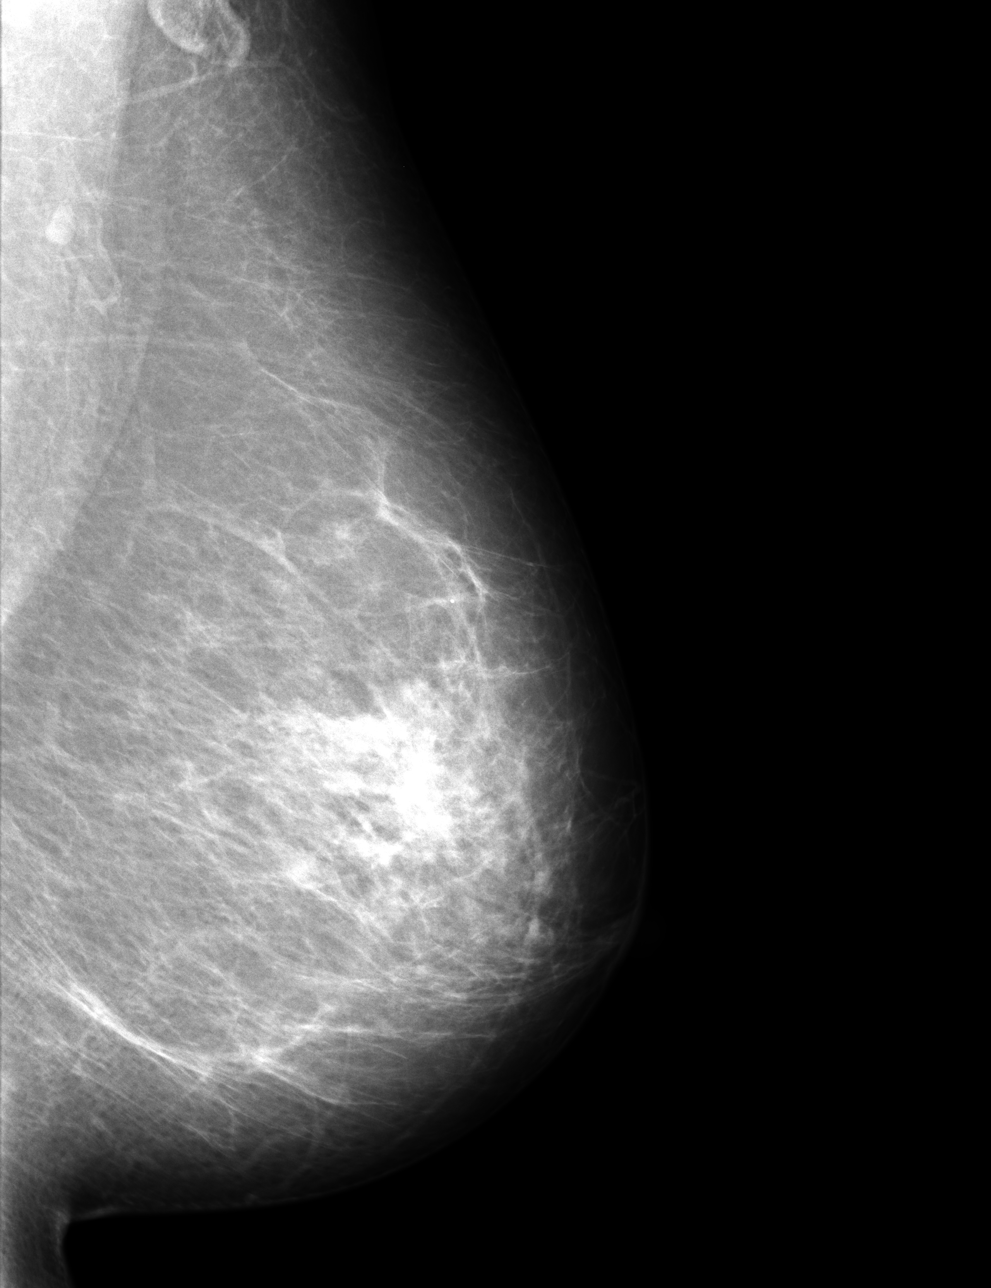

[4 of 4 positions shown; findings below may reference images not displayed]

FINDINGS: Breasts contain scattered fibroglandular elements. No new masses or asymmetries are seen. No new skin thickening, nipple retraction or calcification identified.
IMPRESSION: Bi-Rads 2-Benign findings. 
RECOMMENDATION: Resume routine annual screening. 
Final Assessment Code:
Bi-Rads 2 

BI-RADS 0
Need additional imaging evaluation
BI-RADS 1
Negative mammogram
BI-RADS 2
Benign finding
BI-RADS 3
Probably benign finding: short-interval follow-up suggested
BI-RADS 4
Suspicious abnormality:  biopsy should be considered
BI-RADS 5
Highly suggestive of malignancy; appropriate action should be taken

NOTE:
In compliance with Federal regulations, the results of this mammogram are being sent to the patient.

________________________________
Jumper, Nya., signed this document electronically

## 2013-07-07 IMAGING — MG MAMMO DIGITAL SCREENING
1 series · 4 of 4 positions shown · non-contrast
Comparison: 

------------- REPORT [id number] -------------
[facility]
[address]
Mor Ms.MIO, STU:
We wish to report the following on your recent mammography examination. We are sending a report to your referring physician or other health care provider. 
(       Normal/Negative:
No evidence of cancer.
This statement is mandated by the [place], Department of Health.
Your examination was performed by one of our technologists, who are registered radiological technologists and also specially certified in mammography:
___
Donjuan, Wavy (M)
Tena, Danielf (M)
Gia, Nelith (M)

Your mammogram was interpreted by our radiologist.
( 
Mmamontsho Seakolo, M.D.
(Annual Breast Examination by a physician or other health care provider
(Annual Mammography Screening beginning at age 40
(Monthly Breast Self Examination
------------- REPORT [id number] -------------
MAMMO DIGITAL SCREENING WITH CAD
TSHEWANG NECHEMIA,DO
Exam:  
Bilateral digital screening mammogram with CAD
INDICATION: Annual screening.

[R CC · oblique · right · 4 of 4 slices shown]
[im 1/4]
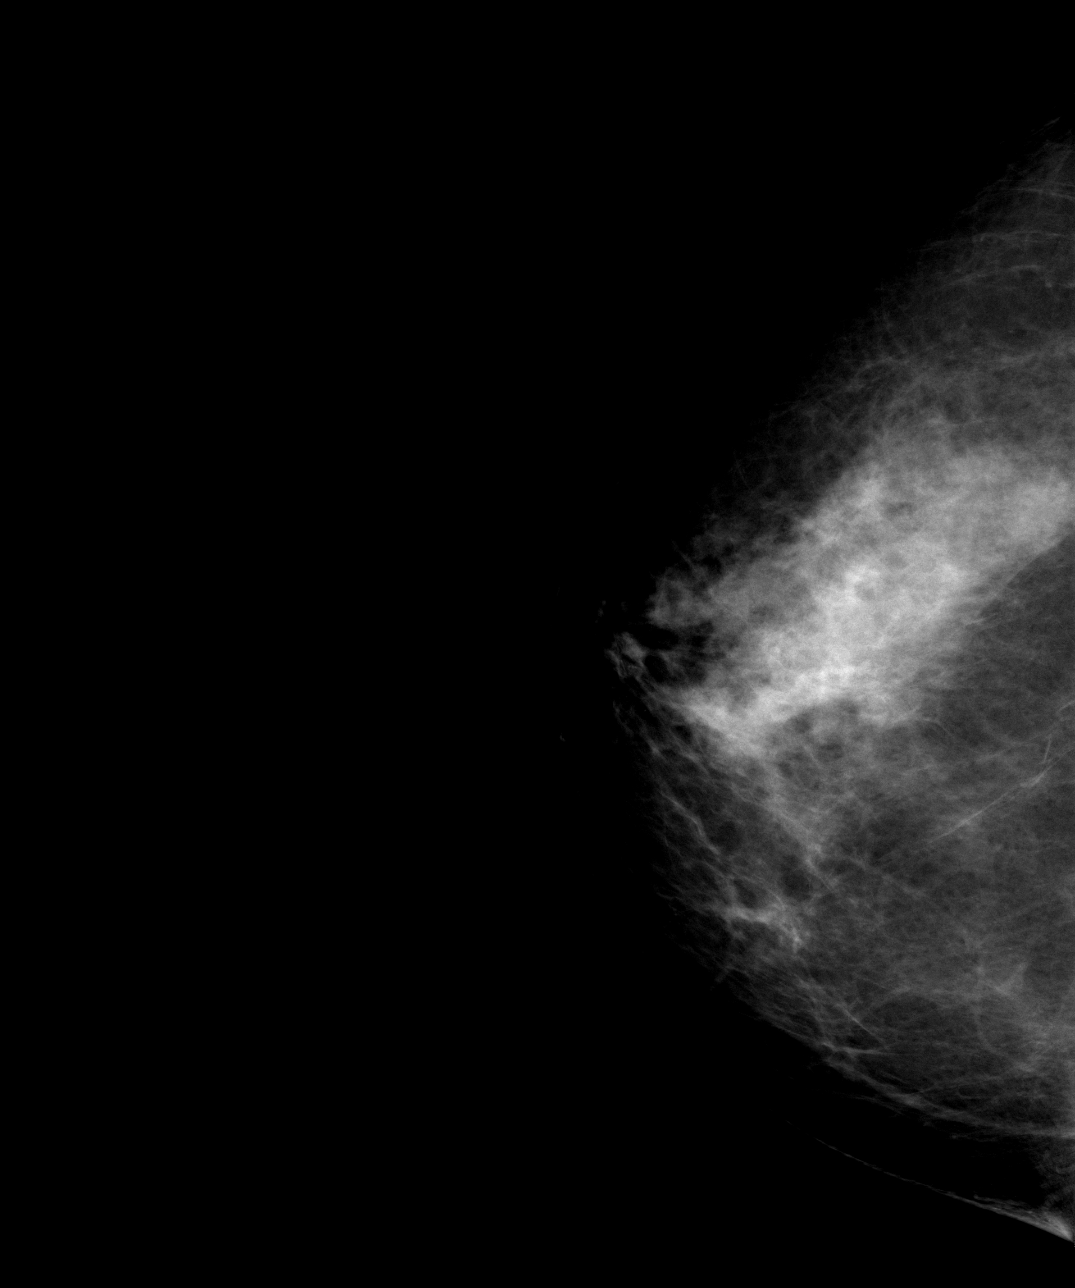
[im 2/4]
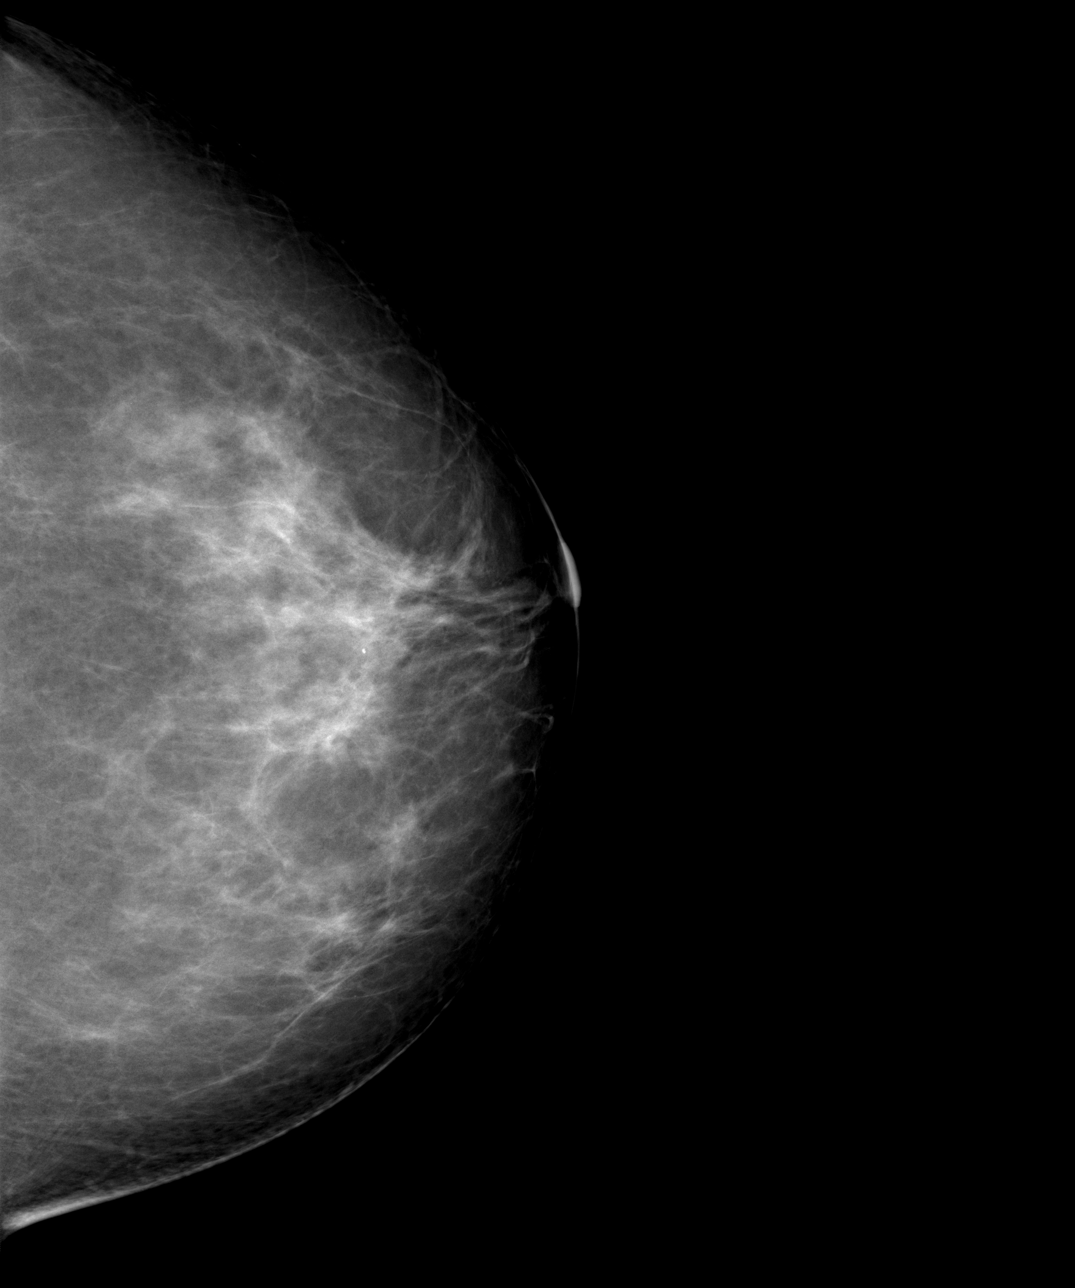
[im 3/4]
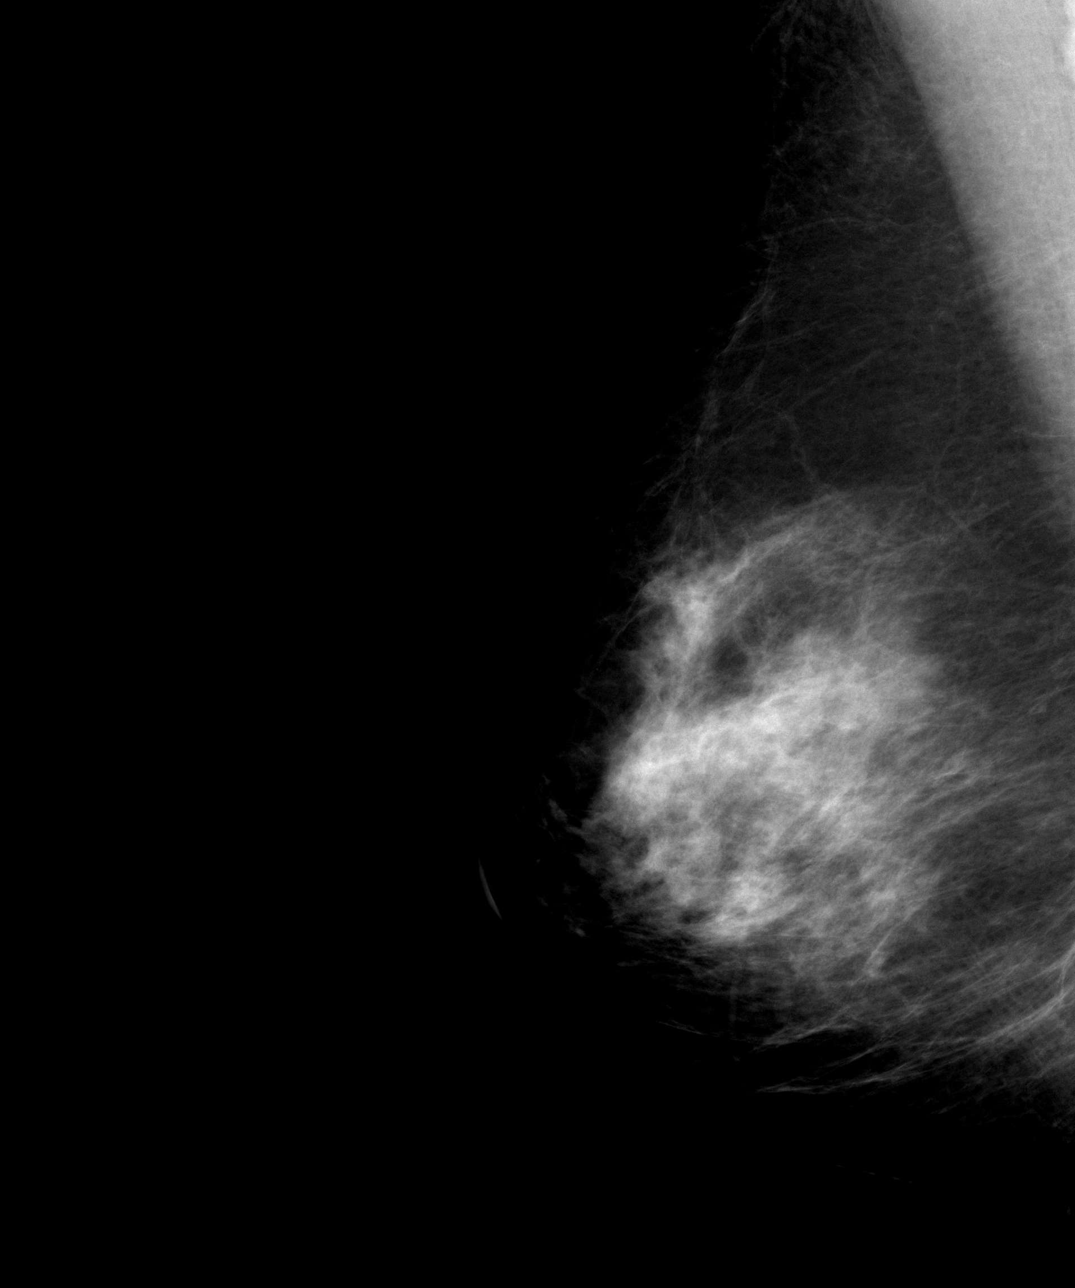
[im 4/4]
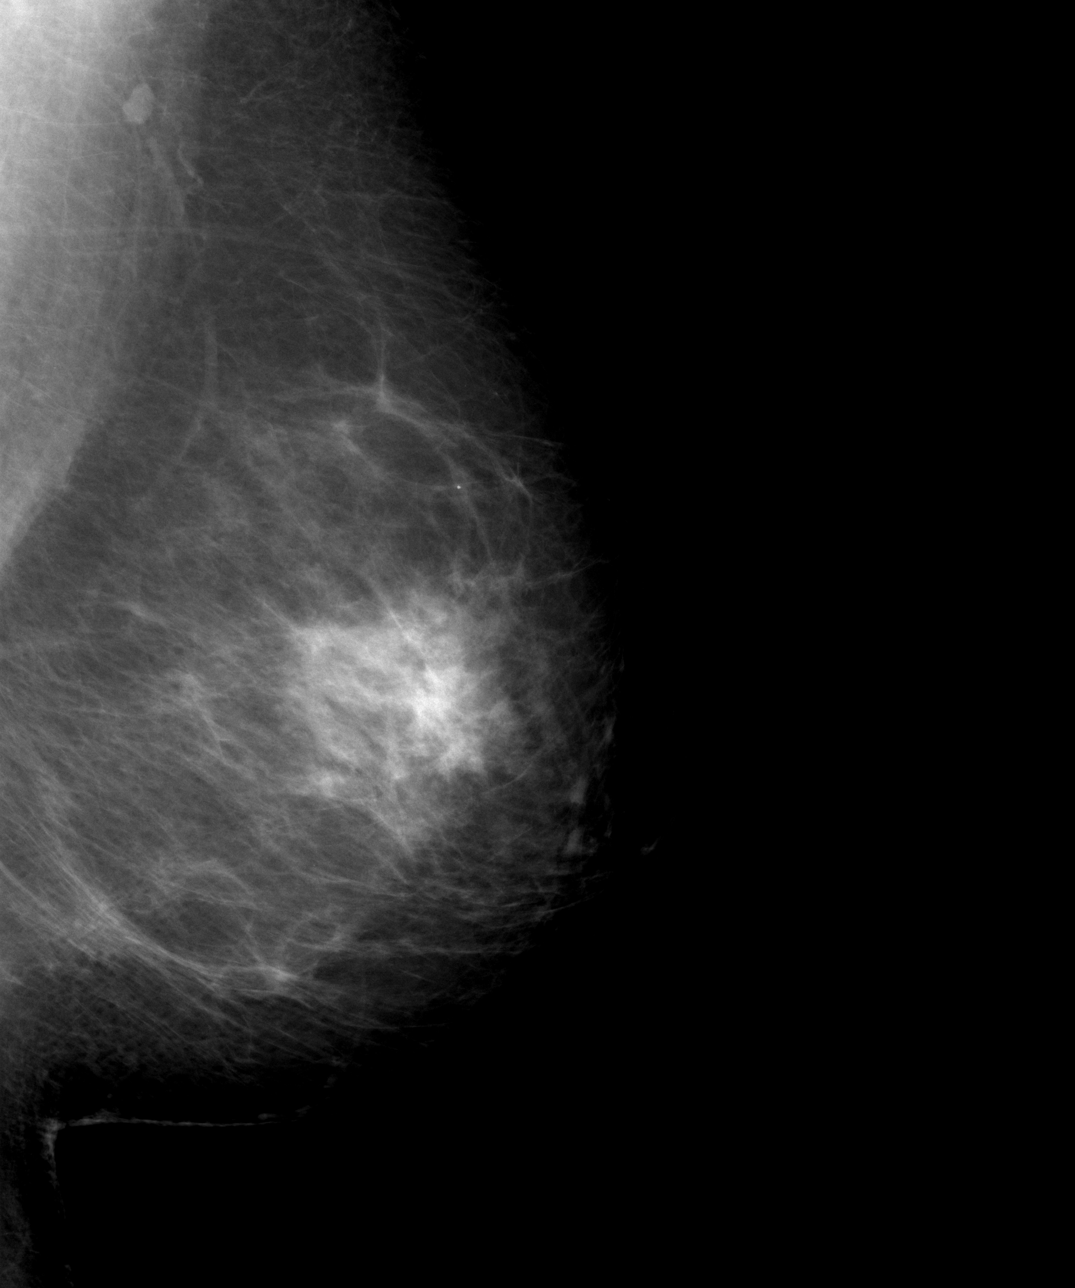

[4 of 4 positions shown; findings below may reference images not displayed]

FINDINGS: There are scattered fibroglandular elements. There is no mass or suspicious cluster of microcalcifications. There is no architectural distortion, skin thickening or nipple retraction.
IMPRESSION: 1.
Bi-Rads 2-Benign findings. 
RECOMMENDATIONS: Annual screening mammogram as per ACR guidelines is recommended. 

Final Assessment Code:
Bi-Rads 2 

BI-RADS 0
Need additional imaging evaluation
BI-RADS 1
Negative mammogram
BI-RADS 2
Benign finding
BI-RADS 3
Probably benign finding: short-interval follow-up suggested
BI-RADS 4
Suspicious abnormality:  biopsy should be considered
BI-RADS 5
Highly suggestive of malignancy; appropriate action should be taken
BI-RADS 6
Known Biopsy-proven Malignancy  Appropriate action should be taken
NOTE:
In compliance with Federal regulations, the results of this mammogram are being sent to the patient.

## 2014-03-02 IMAGING — CT CT SINUS W/CORONALS
2 series · 16 of 40 positions shown, 20 images · non-contrast
Comparison: None.

Exam:   
CT paranasal sinuses without contrast, low dose Safe CT protocol
INDICATION: Headaches.
TECHNIQUE: Axial non contrast CT imaging of the paranasal sinuses was performed. Additional reformatted coronal projections were also obtained. Total DLP 782 mGy cm.

[Series 902: (person_name) axial (safect) · axial · 0.31mm/px · z∈[-757,-657]mm · 13 of 59 slices shown, 17 images]
[im 5/59  brain]
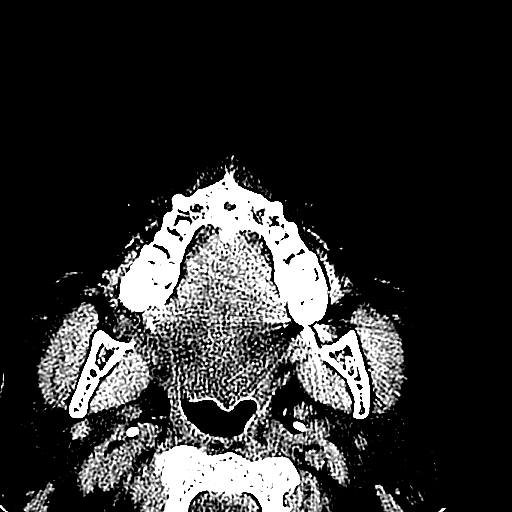
[im 5/59  bone]
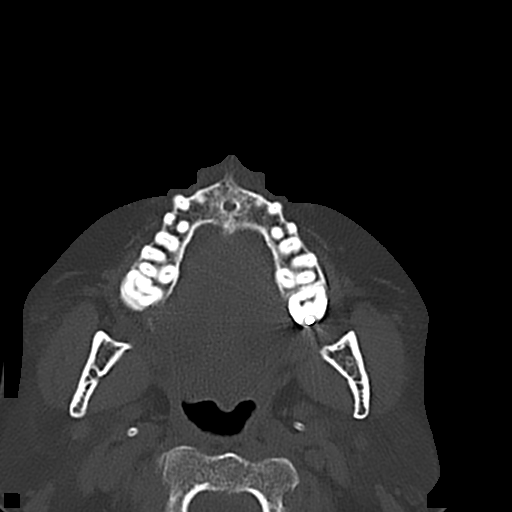
[im 9/59  bone]
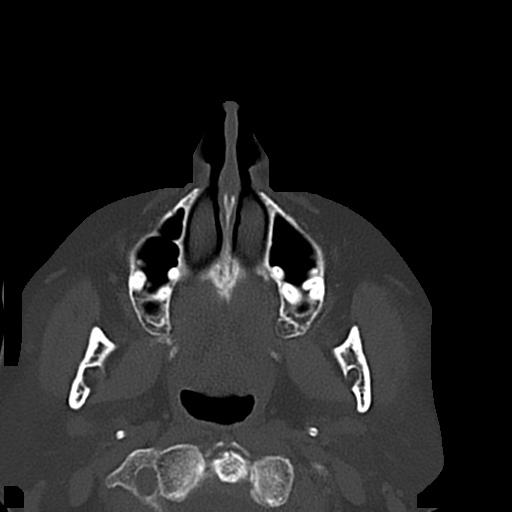
[im 13/59  bone]
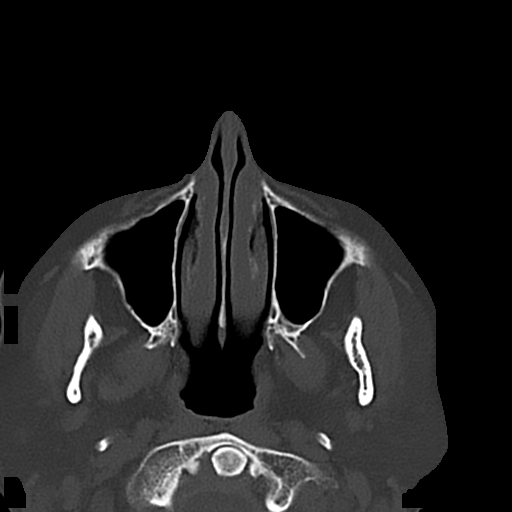
[im 17/59  bone]
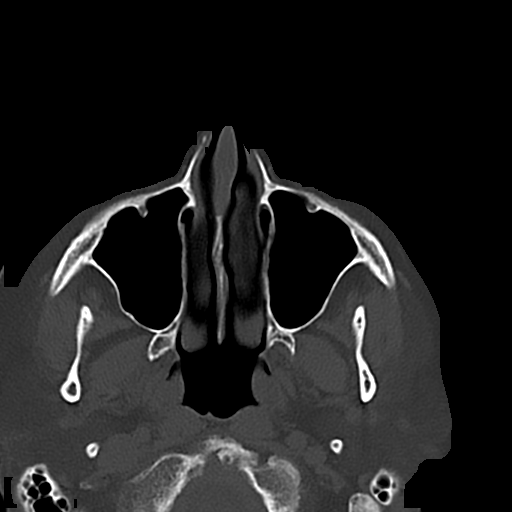
[im 21/59  brain]
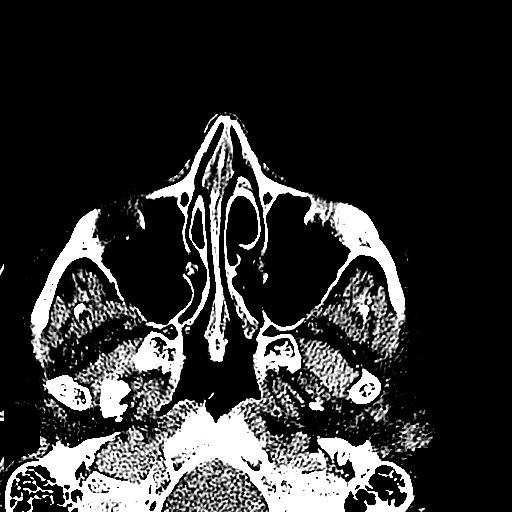
[im 21/59  bone]
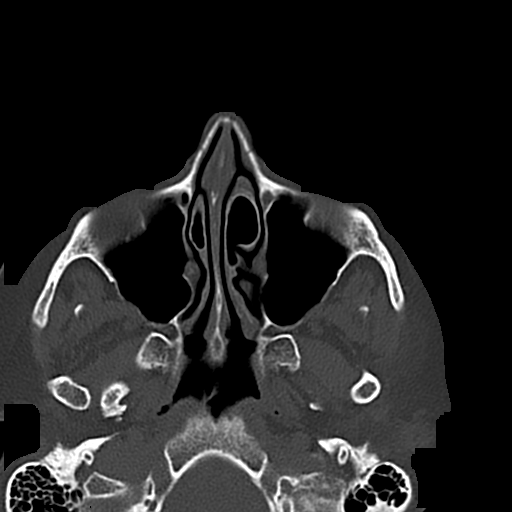
[im 25/59  bone]
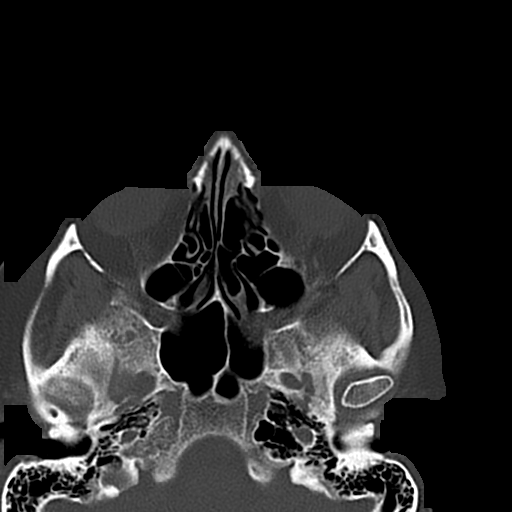
[im 31/59  bone]
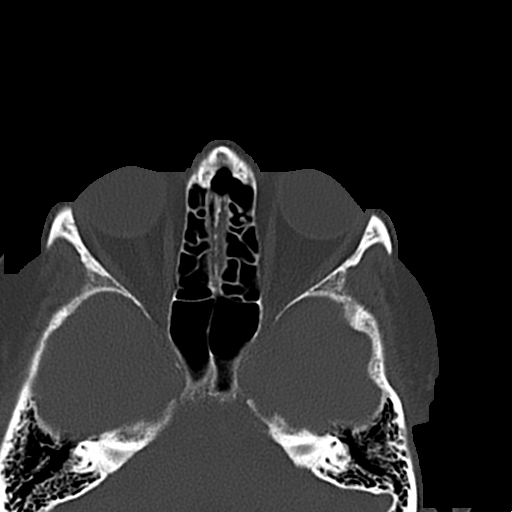
[im 35/59  bone]
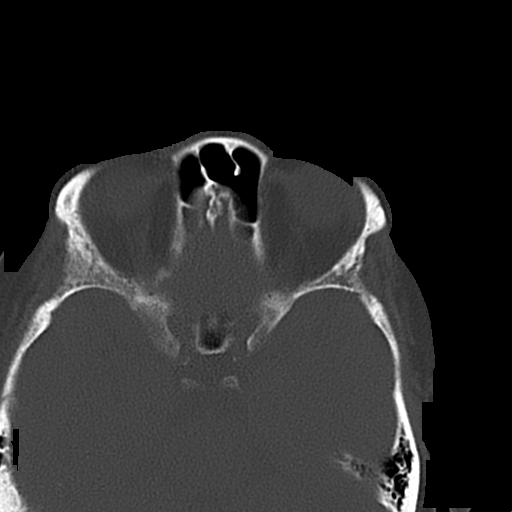
[im 39/59  brain]
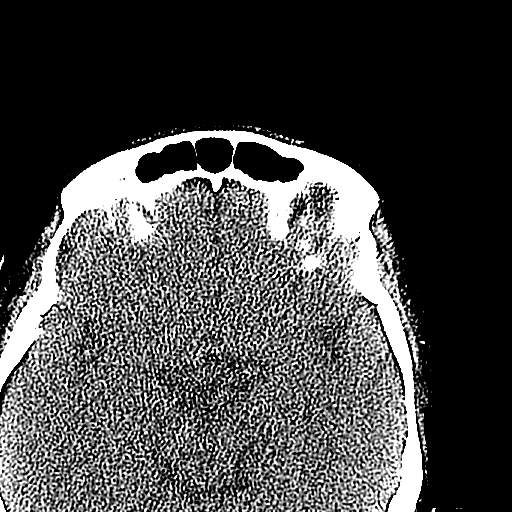
[im 39/59  bone]
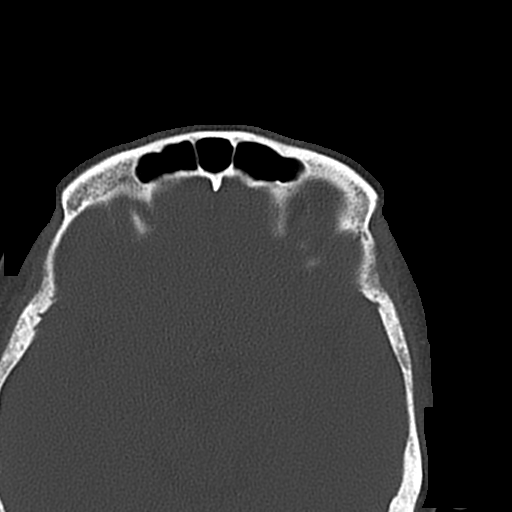
[im 43/59  bone]
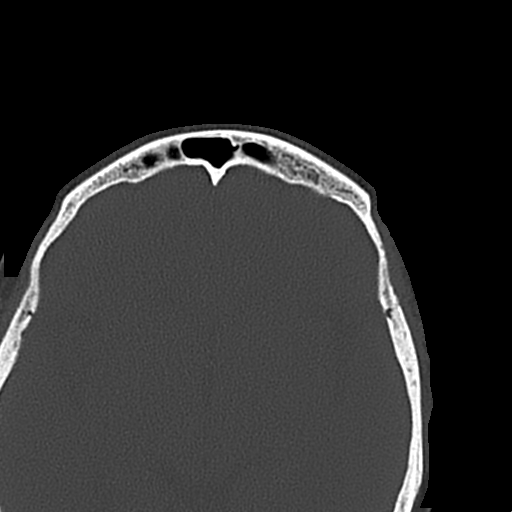
[im 47/59  bone]
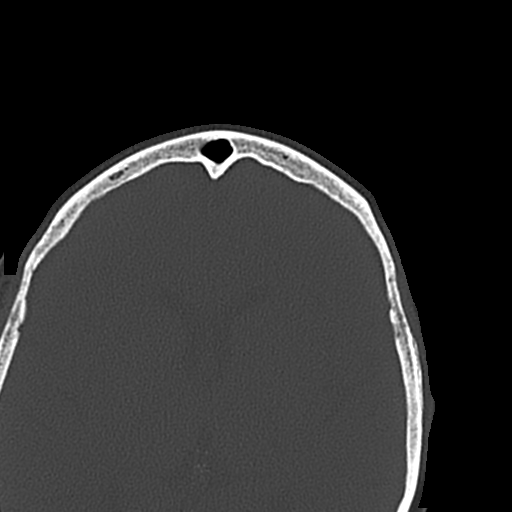
[im 51/59  bone]
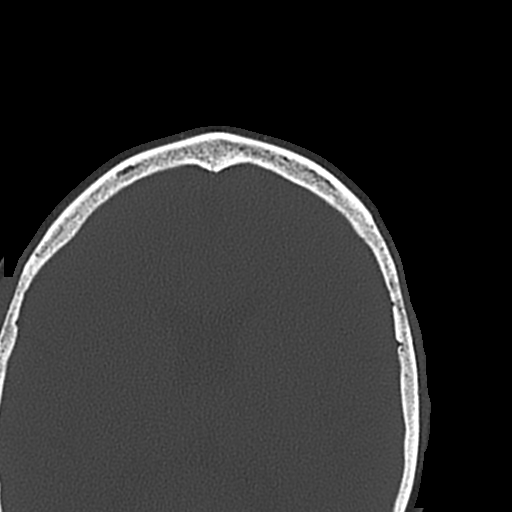
[im 55/59  brain]
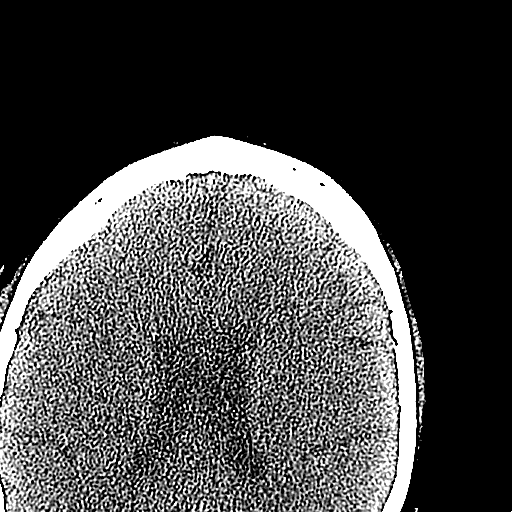
[im 55/59  bone]
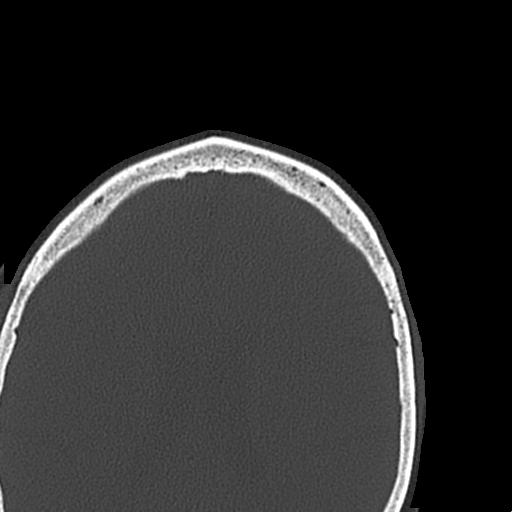

[Series 903: (person_name) (safect) · coronal · 0.38mm/px · 3 of 51 slices shown]
[im 17/51  bone]
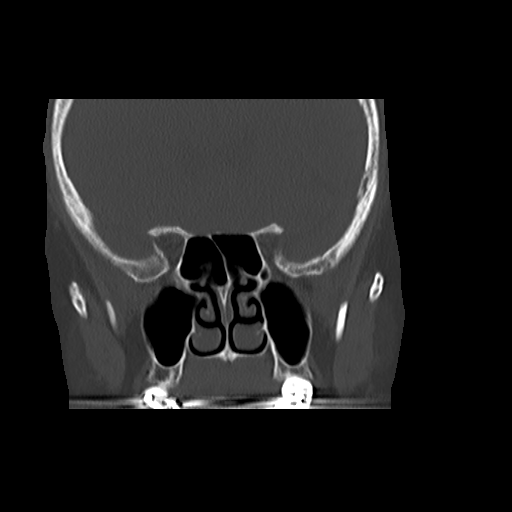
[im 23/51  bone]
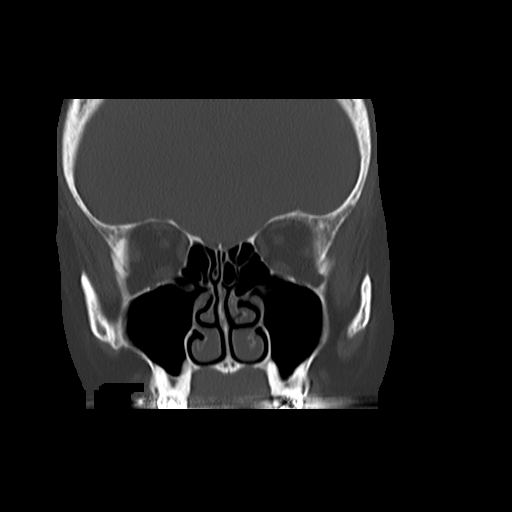
[im 28/51  bone]
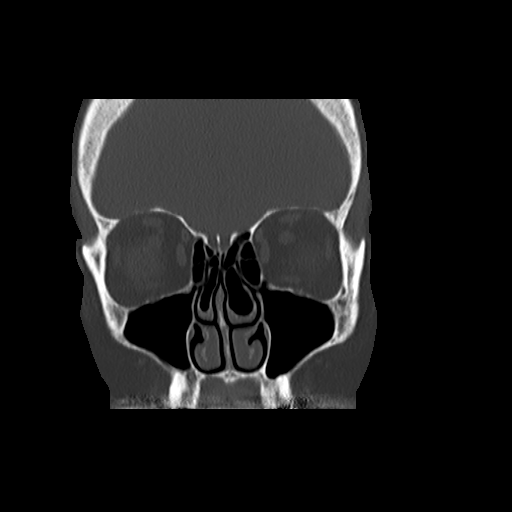

[16 of 40 positions shown; findings below may reference images not displayed]

FINDINGS: Frontal, ethmoid, maxillary and sphenoid sinuses are well aerated without significant opacification. Osteomeatal units are patent bilaterally. There are bilateral concha bullosa. Nasal septum is midline. Orbital contents are unremarkable.
IMPRESSION: 1.
Unremarkable exam.

## 2014-03-02 IMAGING — CT CT HEAD/BRAIN W/O DYE
1 series · 16 of 30 positions shown, 20 images · non-contrast
Comparison: None.

Exam:   
CT head without contrast, low dose Safe CT protocol
INDICATION: Headaches.
TECHNIQUE: Axial non contrast CT imaging was performed from skull base to the vertex. Total DLP 782 mGy cm.

[Series 902: head wo (safect) · axial · 0.55mm/px · z∈[-739,-601]mm · 16 of 50 slices shown, 20 images]
[im 2/50  brain]
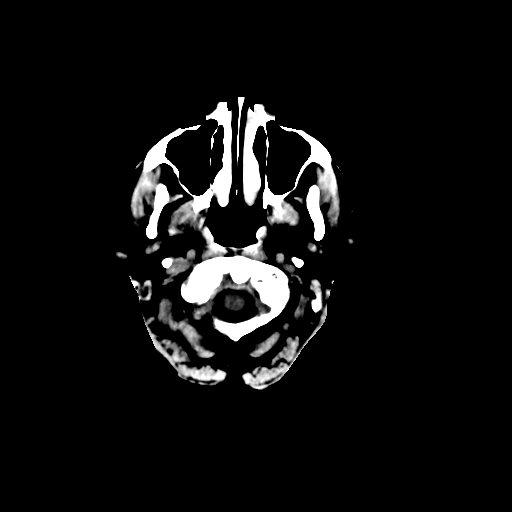
[im 2/50  bone]
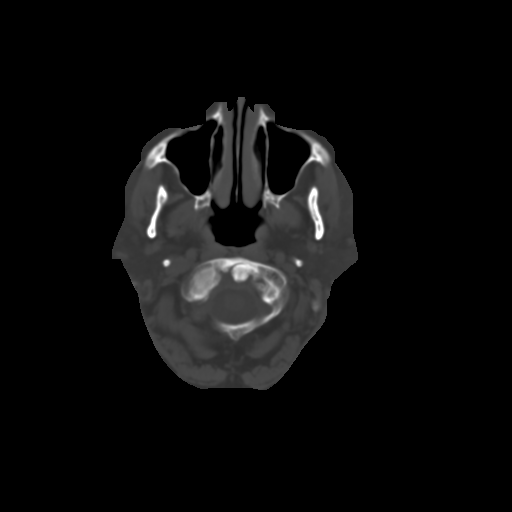
[im 6/50  brain]
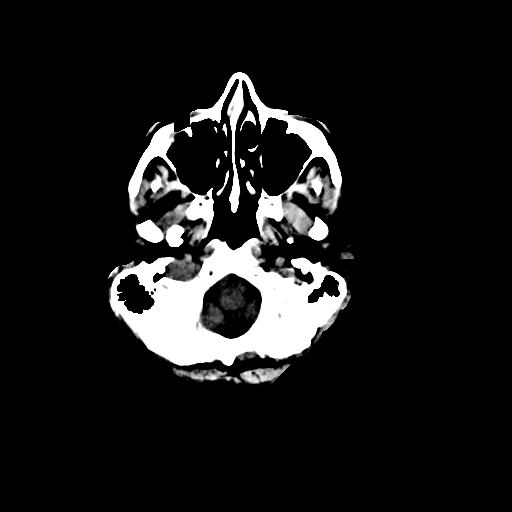
[im 9/50  brain]
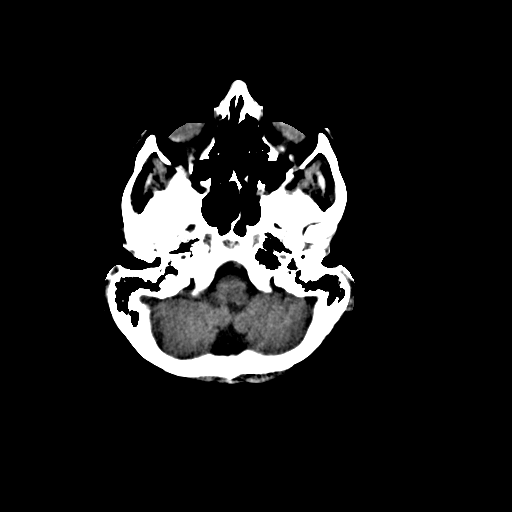
[im 12/50  brain]
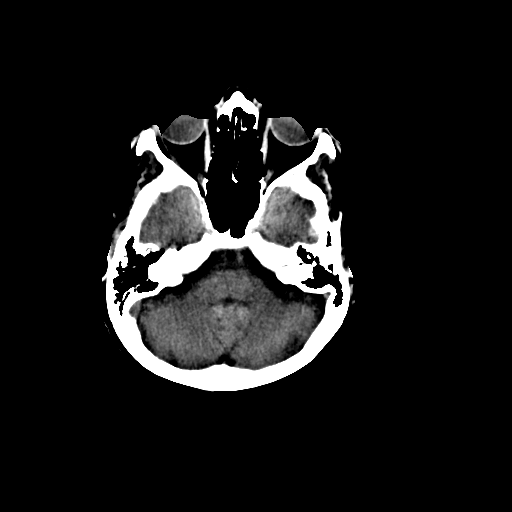
[im 14/50  brain]
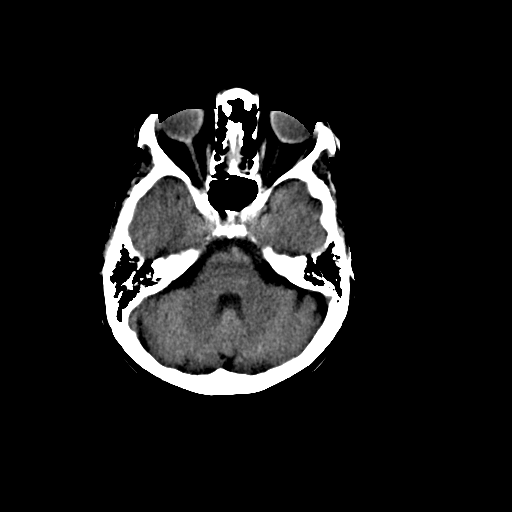
[im 14/50  bone]
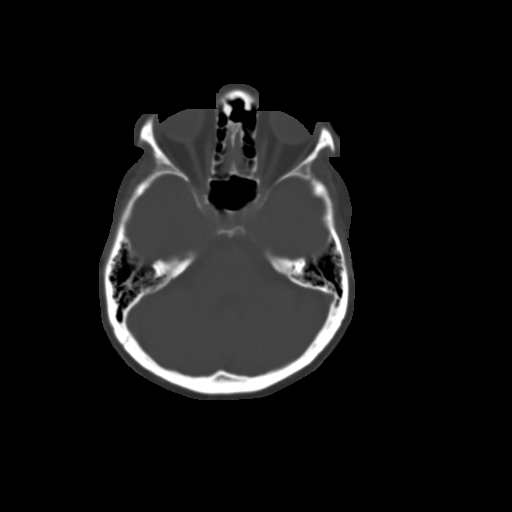
[im 17/50  brain]
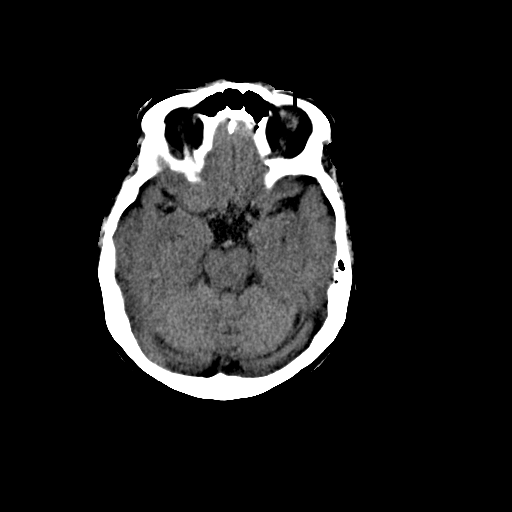
[im 21/50  brain]
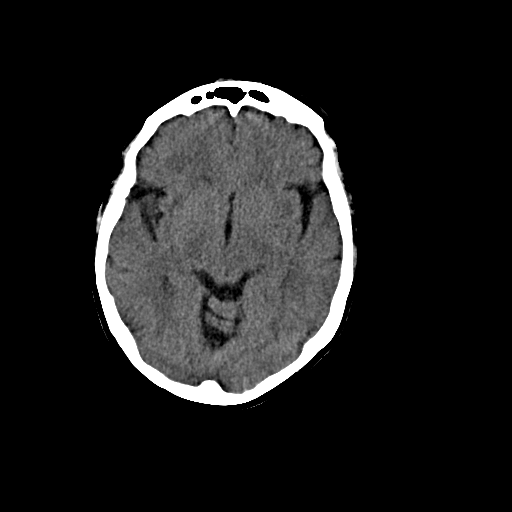
[im 24/50  brain]
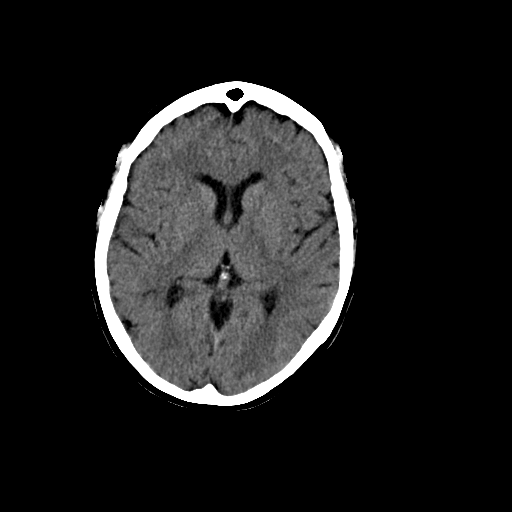
[im 26/50  brain]
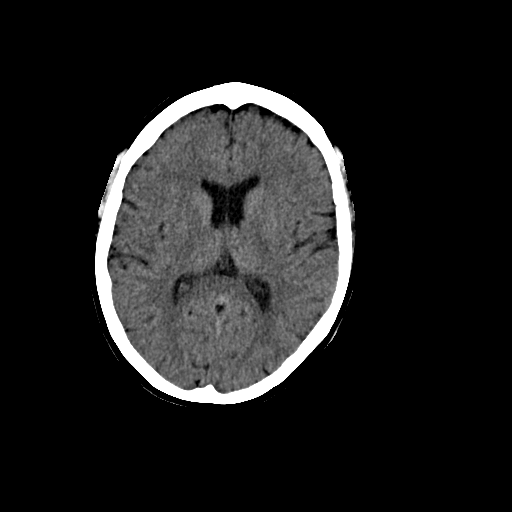
[im 26/50  bone]
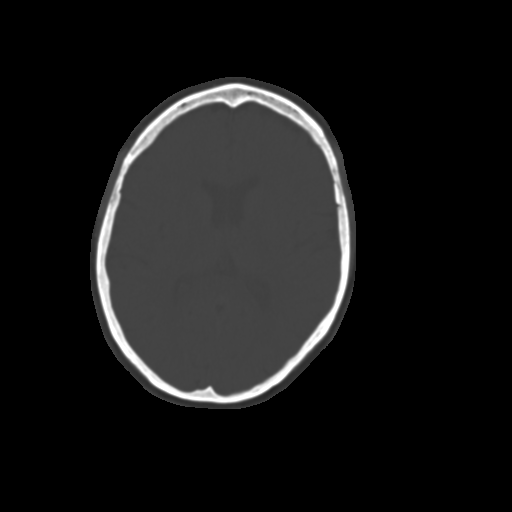
[im 29/50  brain]
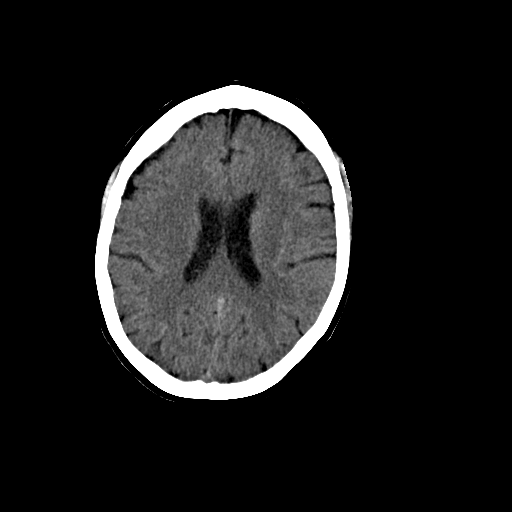
[im 33/50  brain]
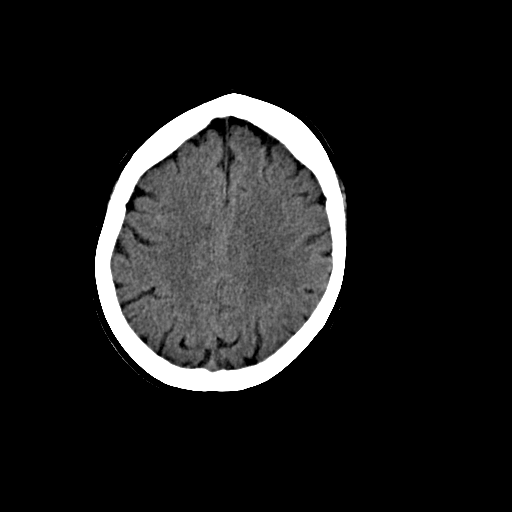
[im 36/50  brain]
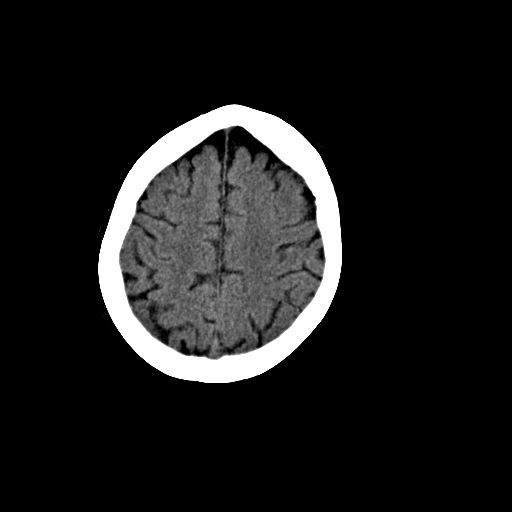
[im 38/50  brain]
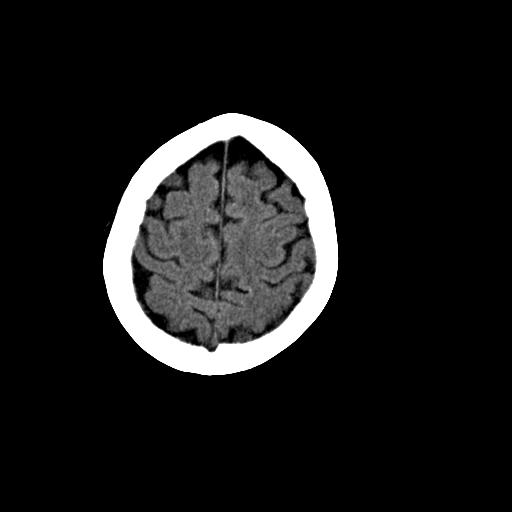
[im 38/50  bone]
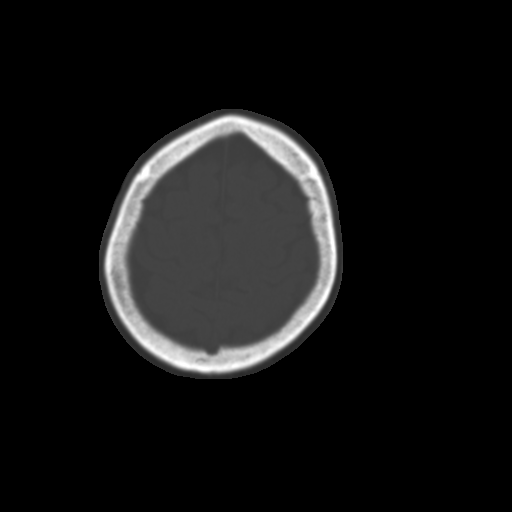
[im 41/50  brain]
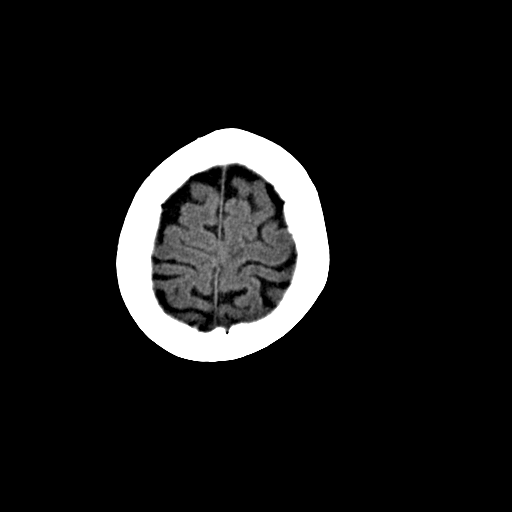
[im 44/50  brain]
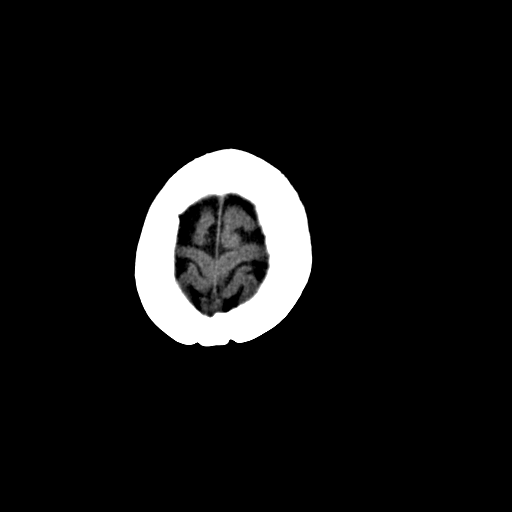
[im 48/50  brain]
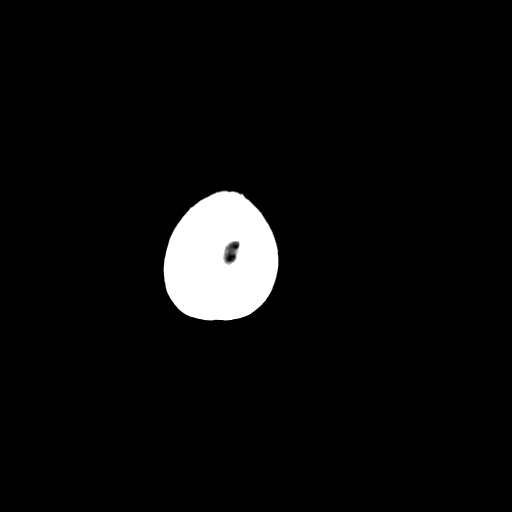

[16 of 30 positions shown; findings below may reference images not displayed]

FINDINGS: Ventricular and sulcal size is normal for the patients age. There is no mass effect, midline shift or intracranial hemorrhage. There is no evidence of acute infarction. No extra-axial fluid collections are identified. Visualized paranasal sinuses, mastoid air cells and orbital contents are unremarkable.
IMPRESSION: 1.
No acute intracranial abnormality.

## 2014-07-14 IMAGING — MG MAMMO DIGITAL SCREENING
1 series · 7 of 7 positions shown · non-contrast
Comparison: Previous examinations dated 12/26/13 and 12/14/12.

------------- REPORT [id number] -------------
MAMMO DIGITAL SCREENING WITH CAD

RRUSTAJ TDO,DO
Exam:  
Bilateral digital screening mammogram with CAD
HISTORY: Asymptomatic 63 year old with no family history of breast cancer in first degree relatives.

[R CC · oblique · right · 7 of 7 slices shown]
[im 1/7]
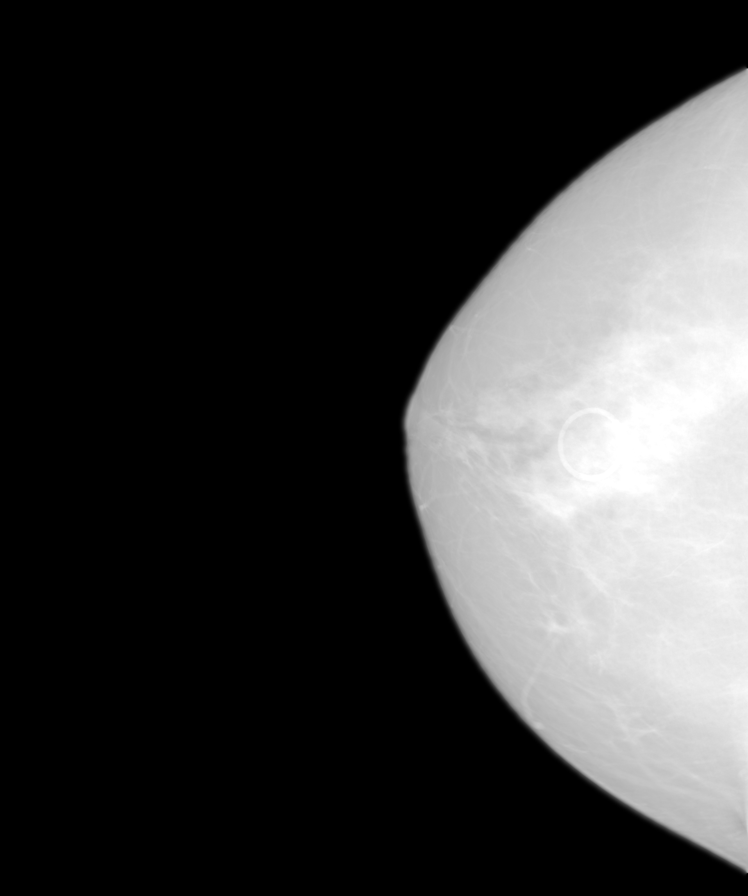
[im 2/7]
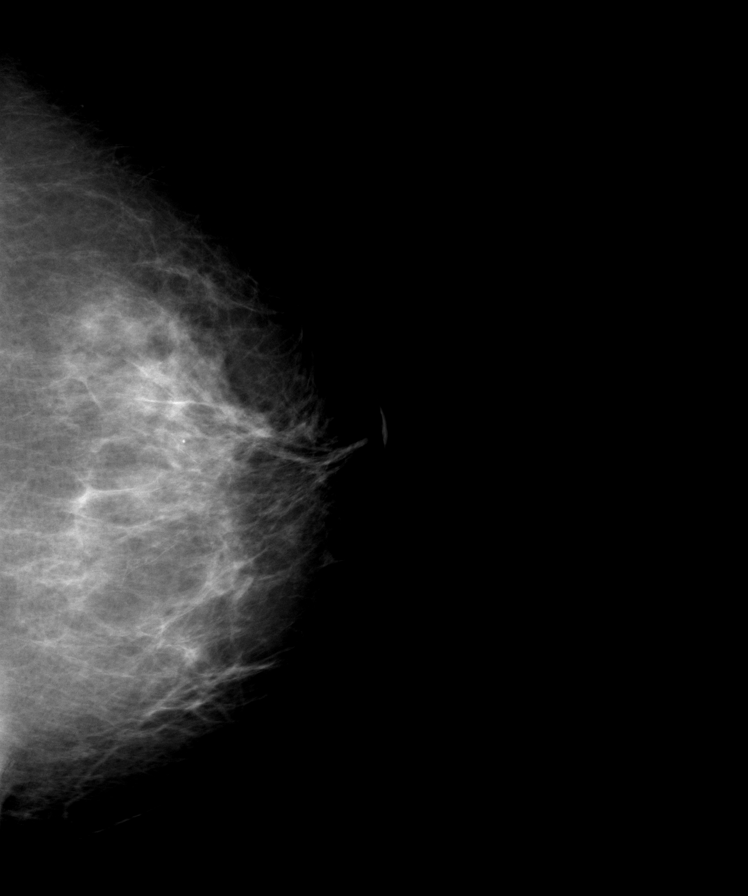
[im 3/7]
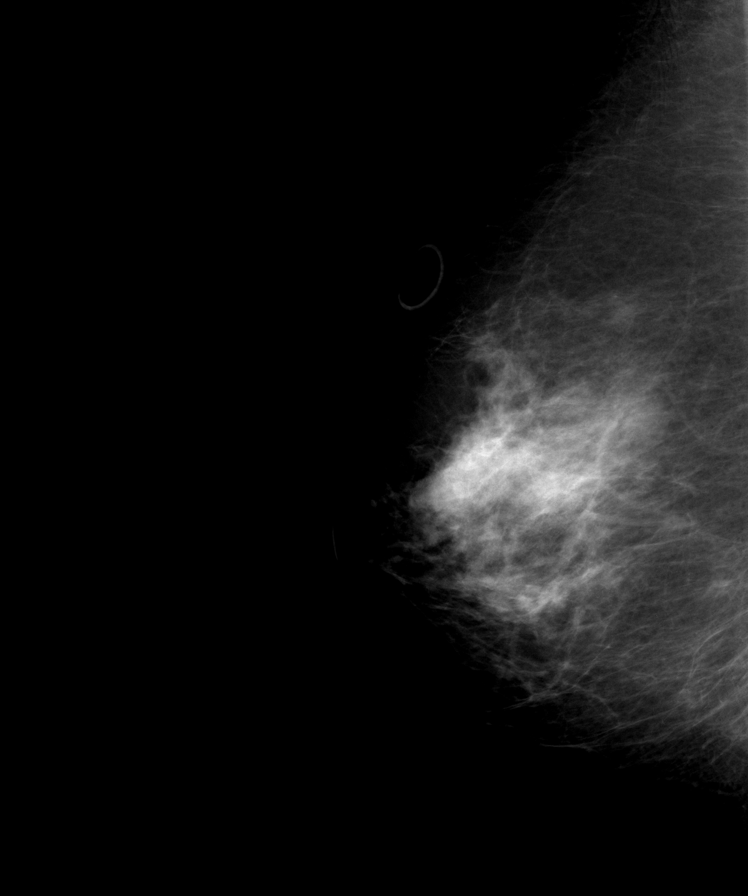
[im 4/7]
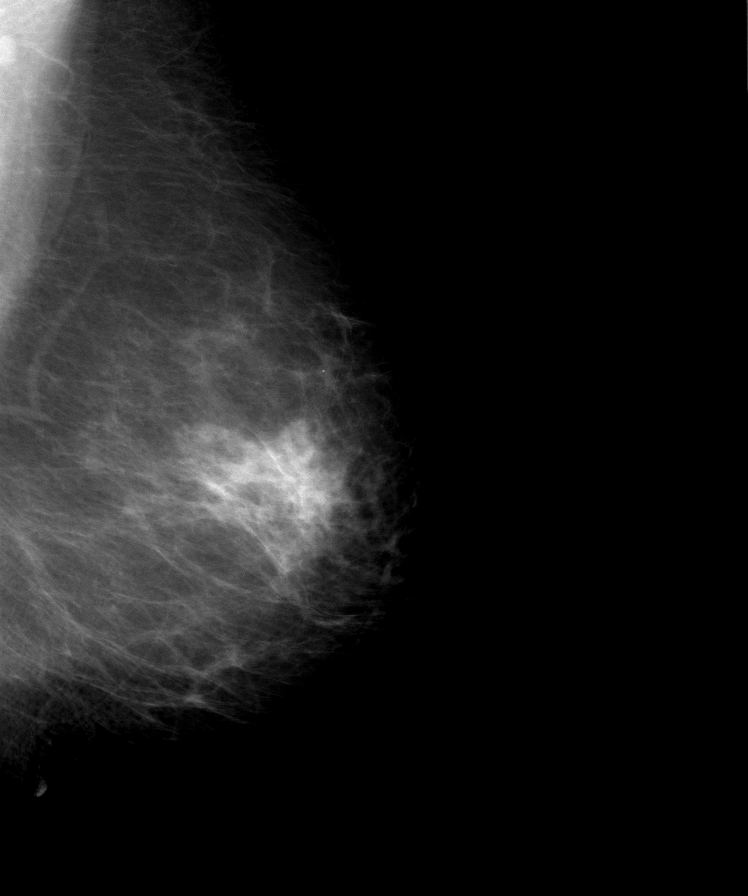
[im 5/7]
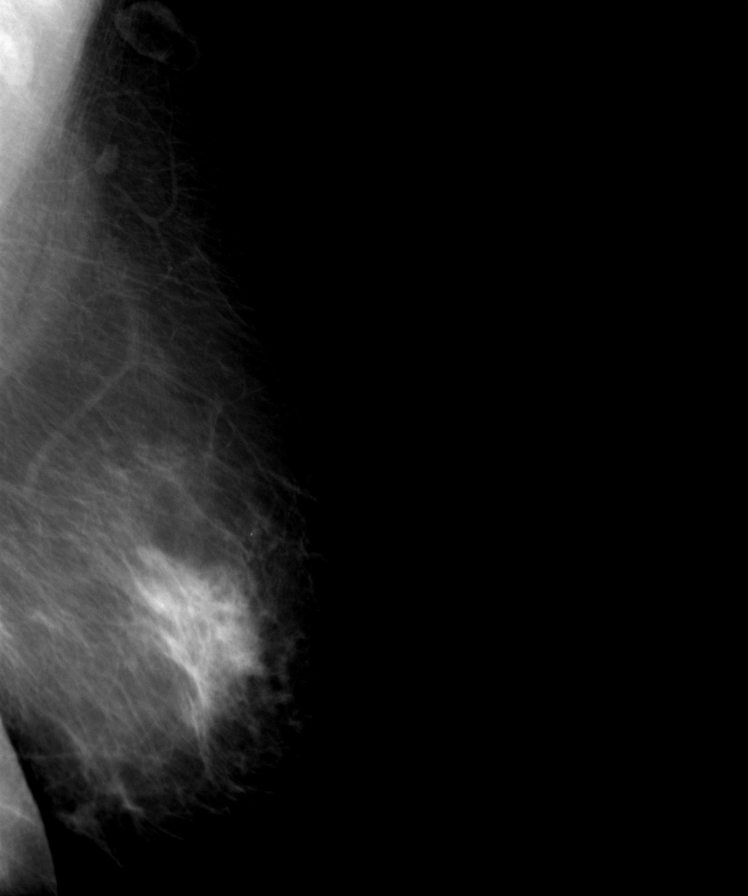
[im 6/7]
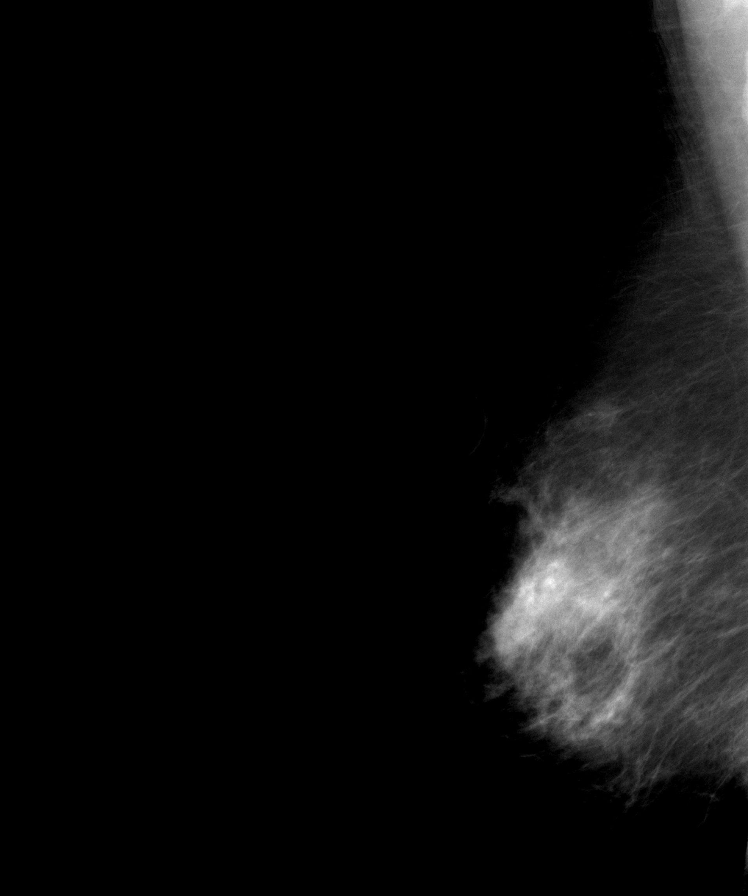
[im 7/7]
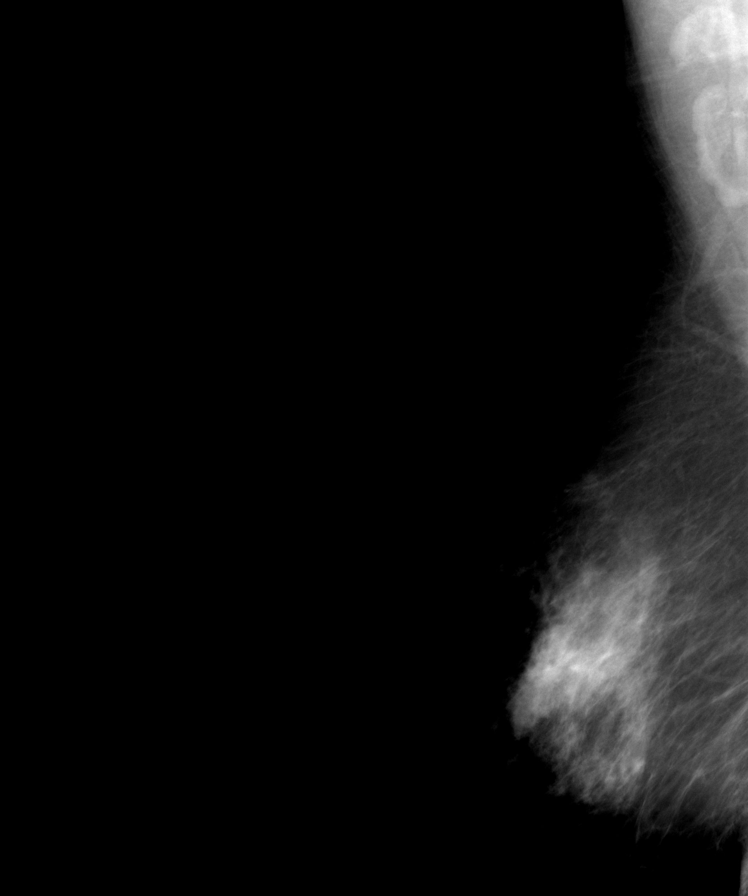

[7 of 7 positions shown; findings below may reference images not displayed]

FINDINGS: Breast tissues are mildly dense. No focal mass or architectural change is noted. No abnormal calcific density, skin changes or nipple changes are seen.
IMPRESSION: 1.
Stable mammographic findings. 
2.
Clinical and mammographic followup are indicated at 12 months. 
Final Assessment Code:
Bi-Rads 2, breast density category B

BI-RADS 0
Need additional imaging evaluation
BI-RADS 1
Negative mammogram
BI-RADS 2
Benign finding
BI-RADS 3
Probably benign finding: short-interval follow-up suggested
BI-RADS 4
Suspicious abnormality:  biopsy should be considered
BI-RADS 5
Highly suggestive of malignancy; appropriate action should be taken
BI-RADS 6
Known Biopsy-proven Malignancy  Appropriate action should be taken
NOTE:
In compliance with Federal regulations, the results of this mammogram are being sent to the patient.

------------- REPORT [id number] -------------
[facility]
[address]
Mor Ms.MIO, STU:
We wish to report the following on your recent mammography examination. We are sending a report to your referring physician or other health care provider. 
(       Normal/Negative:
No evidence of cancer.
This statement is mandated by the [place] of Health.
Your examination was performed by one of our technologists, who are registered radiological technologists and also specially certified in mammography:
___
Donjuan, Wavy (M)
___
Tena, Danielf (M)
___
Gia, Nelith (M)

Your mammogram was interpreted by our radiologist.

( 
Mmamontsho Seakolo, M.D.

(Annual Breast Examination by a physician or other health care provider
(Annual Mammography Screening beginning at age 40
(Monthly Breast Self Examination

## 2015-07-20 IMAGING — MG MAMMO DIGITAL SCREENING
1 series · 5 of 5 positions shown · non-contrast
Comparison: Exam dated 10/05/13 and 10/24/15.

------------- REPORT [id number] -------------
MAMMO DIGITAL SCREENING WITH CAD

ORSANSZKI KOSOCZKY,DO
Exam:
Bilateral digital screening mammogram with CAD
HISTORY: Asymptomatic 64-year-old with no family history of breast cancer in first degree relatives.

[R CC · oblique · right · 5 of 5 slices shown]
[im 1/5]
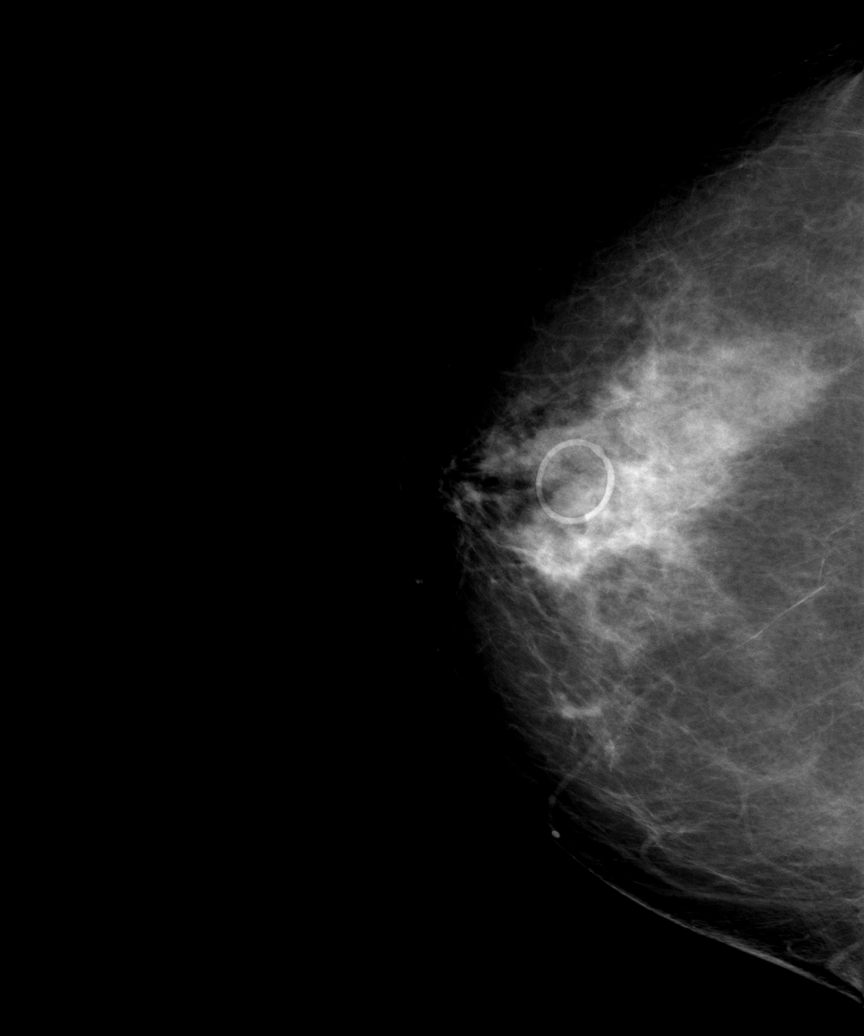
[im 2/5]
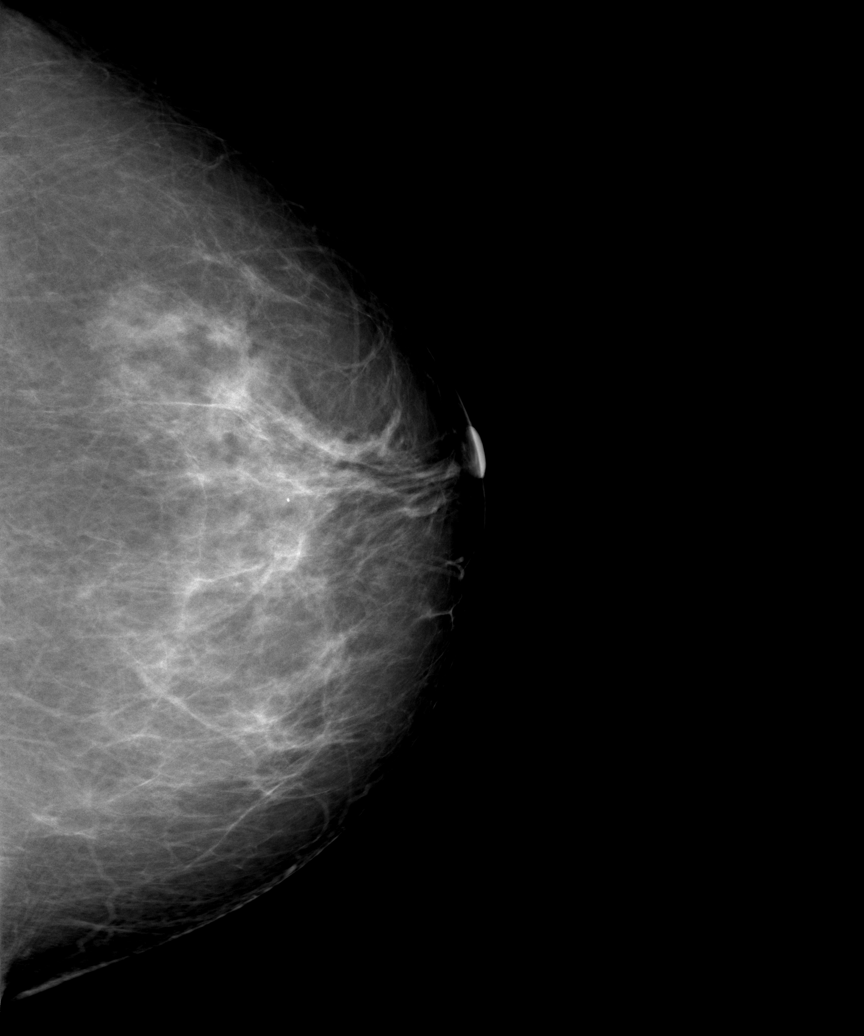
[im 3/5]
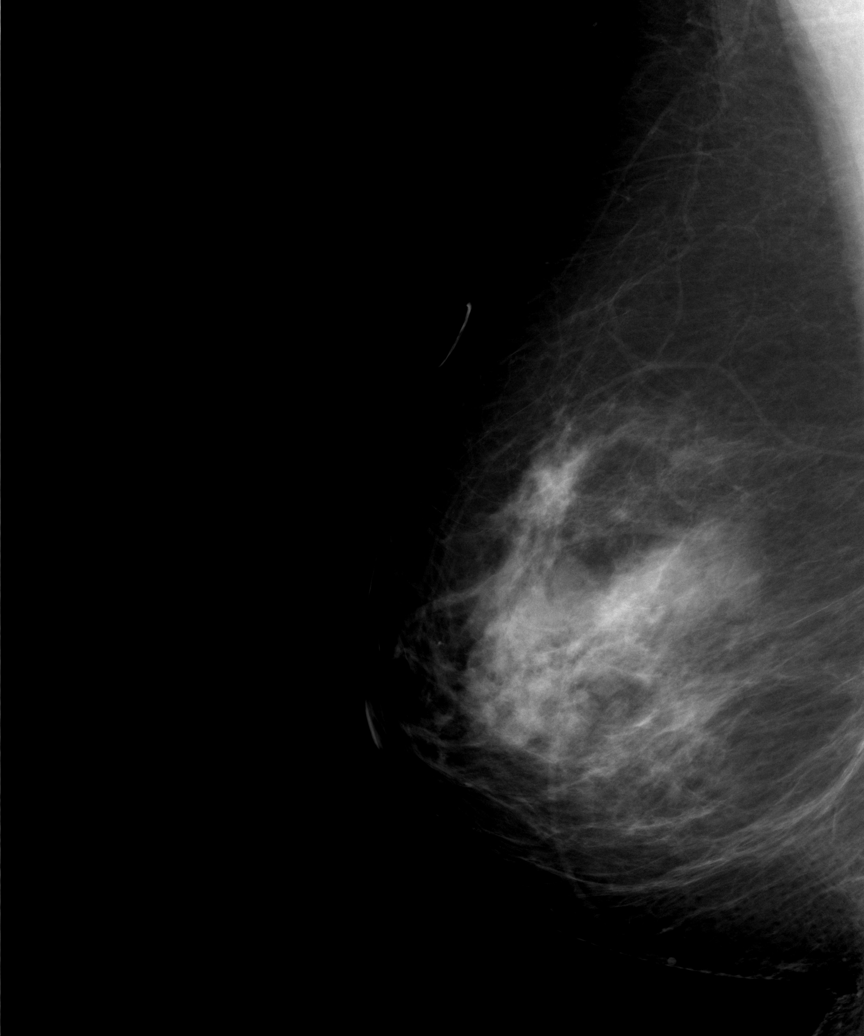
[im 4/5]
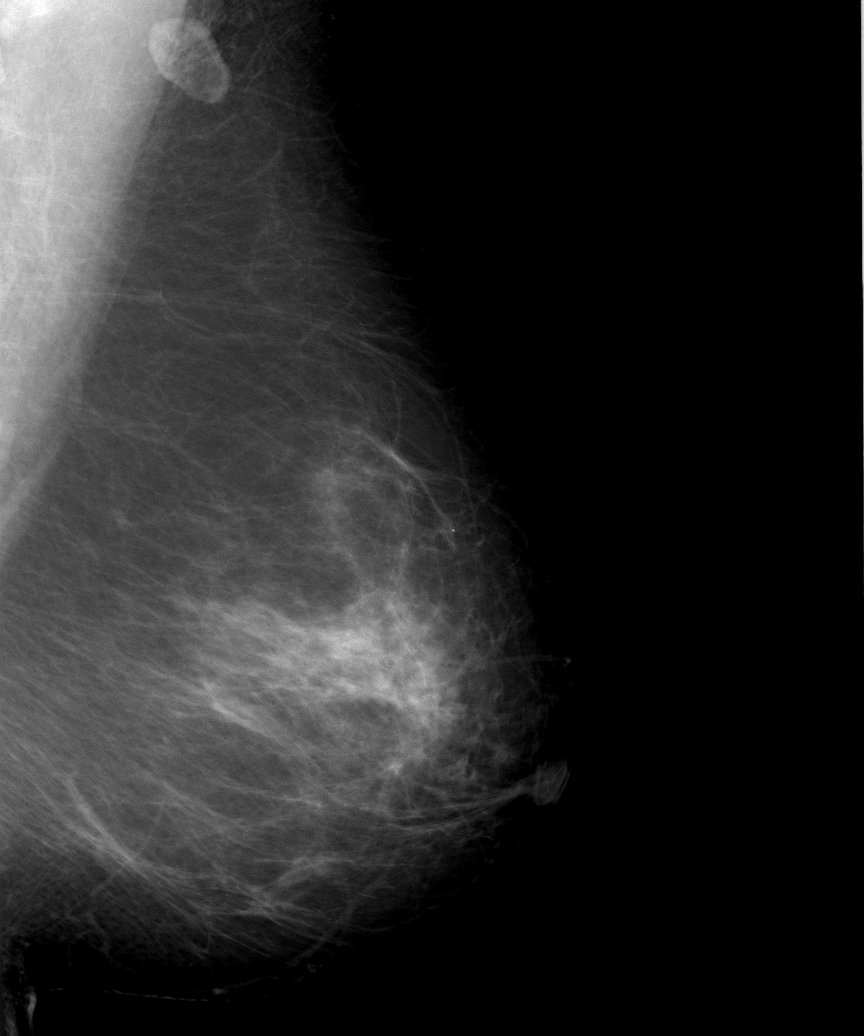
[im 5/5]
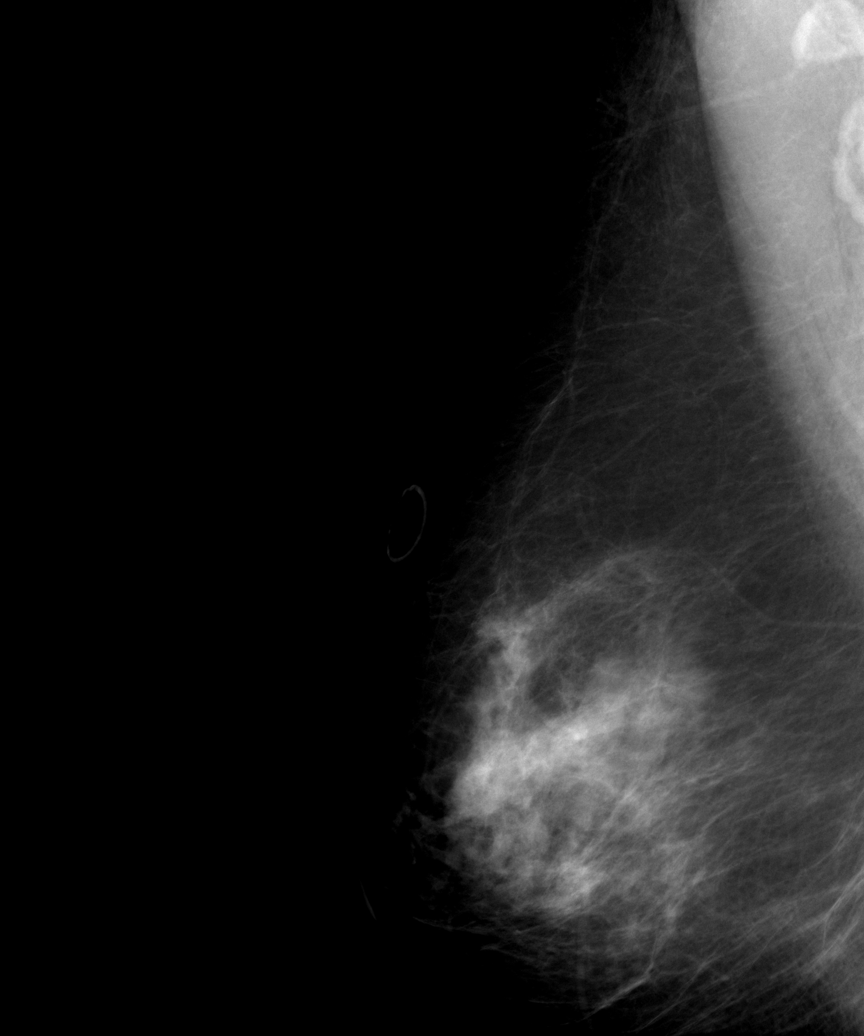

[5 of 5 positions shown; findings below may reference images not displayed]

FINDINGS: No focal mass or architectural change is noted. Asymmetry of breast density on the right compared to the left is stable in appearance. No new architectural change, skin change, nipple change or duct dilation or abnormal calcifications are seen.
IMPRESSION: 1.
Stable mammographic findings including asymmetry of breast tissues on the right compared to the left. 
2.
Clinical and mammographic followup at 12 months. 

Final Assessment Code:
Bi-Rads 2, density category B

BI-RADS 0
Need additional imaging evaluation
BI-RADS 1
Negative mammogram
BI-RADS 2
Benign finding
BI-RADS 3
Probably benign finding: short-interval follow-up suggested
BI-RADS 4
Suspicious abnormality:  biopsy should be considered
BI-RADS 5
Highly suggestive of malignancy; appropriate action should be taken
BI-RADS 6
Known Biopsy-proven Malignancy  Appropriate action should be taken
NOTE:
In compliance with Federal regulations, the results of this mammogram are being sent to the patient.

------------- REPORT [id number] -------------
[facility]
[address]
Daina Ms.JUMPER, KOLAWOLE:
We wish to report the following on your recent mammography examination. We are sending a report to your referring physician or other health care provider. 
(       Normal/Negative:
No evidence of cancer.
This statement is mandated by the [place], Department of Health.
Your examination was performed by one of our technologists, who are registered radiological technologists and also specially certified in mammography:
___
Parlak, Edaly (M)
___
Dang, Mcalex (M)

Your mammogram was interpreted by our radiologist.

( 
Sofeine Made, M.D.

(Annual Breast Examination by a physician or other health care provider
(Annual Mammography Screening beginning at age 40
(Monthly Breast Self Examination

## 2020-08-29 IMAGING — MR MRI BRAIN WITHOUT CONTRAST
8 of 9 series · 37 of 48 positions shown · non-contrast
Comparison: CT head dated 03/02/2014.

﻿EXAM:  MRI BRAIN WITHOUT CONTRAST
INDICATION: Dementia.
TECHNIQUE: Noncontrast multiplanar multisequential MRI of the brain was performed.

[Series 5: DWI · axial · 5.0mm · 1.35mm/px · z∈[-34,+86]mm · 10 of 88 slices shown (1 of 3)]
[im 6/88]
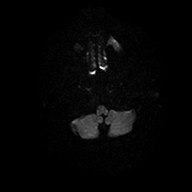
[im 12/88]
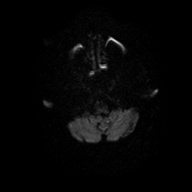
[im 18/88]
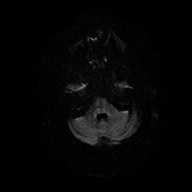
[im 30/88]
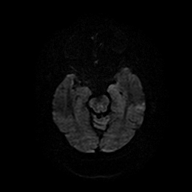
[im 41/88]
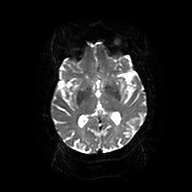
[im 47/88]
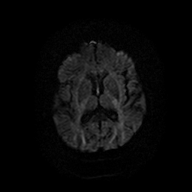
[im 53/88]
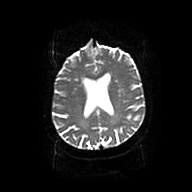
[im 64/88]
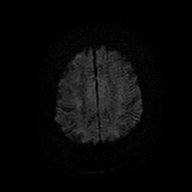
[im 76/88]
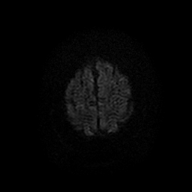
[im 88/88]
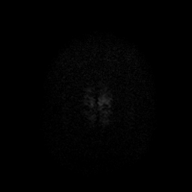

[Series 6: DWI · axial · 5.0mm · 1.35mm/px · z∈[-40,+86]mm · 4 of 22 slices shown (2 of 3)]
[im 1/22]
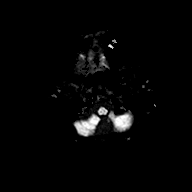
[im 8/22]
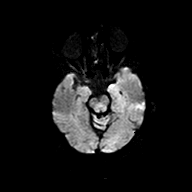
[im 15/22]
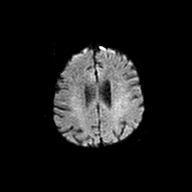
[im 22/22]
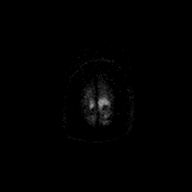

[Series 7: DWI · axial · 5.0mm · 1.35mm/px · z∈[-40,+86]mm · 4 of 22 slices shown (3 of 3)]
[im 1/22]
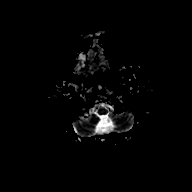
[im 8/22]
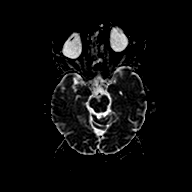
[im 15/22]
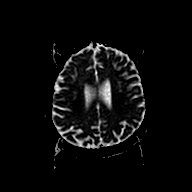
[im 22/22]
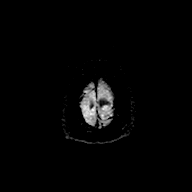

[Series 8: FLAIR · sagittal · 4.0mm · 0.75mm/px · 4 of 26 slices shown (1 of 2)]
[im 1/26]
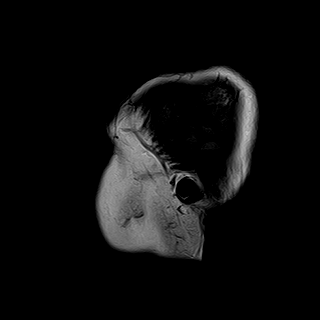
[im 9/26]
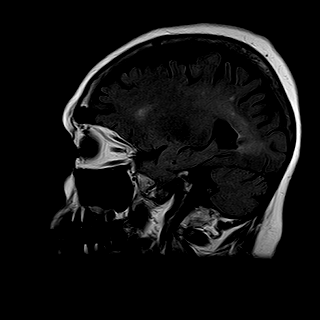
[im 17/26]
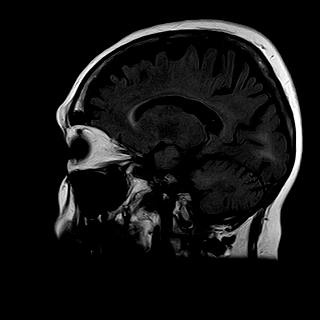
[im 26/26]
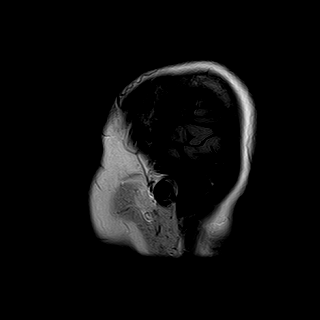

[Series 9: T2 · axial · 5.0mm · 0.43mm/px · z∈[-55,+89]mm · 4 of 25 slices shown (1 of 2)]
[im 1/25]
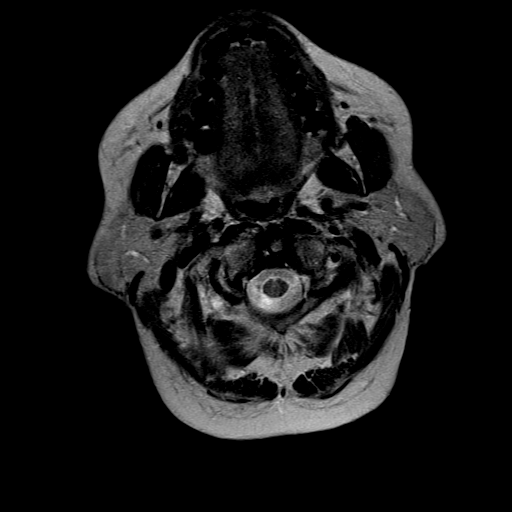
[im 9/25]
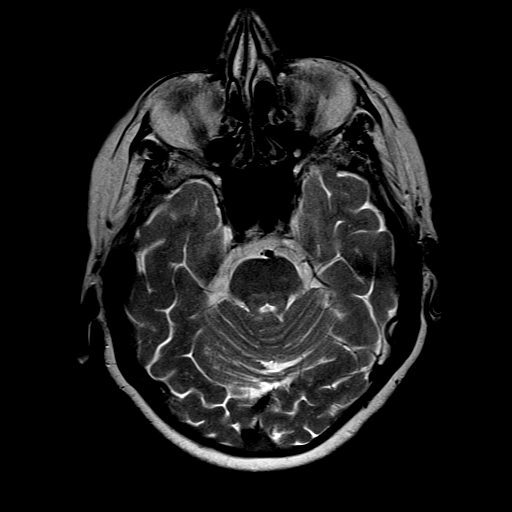
[im 17/25]
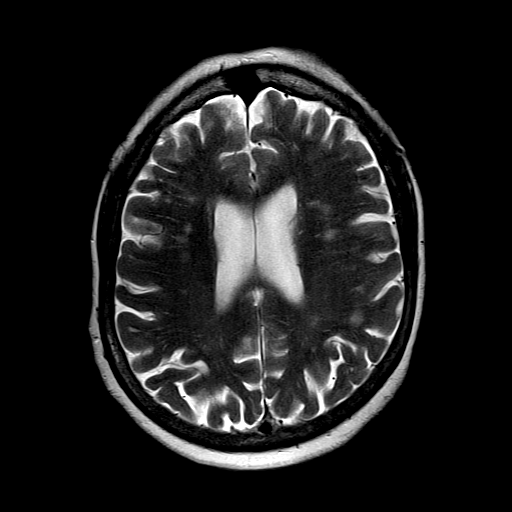
[im 25/25]
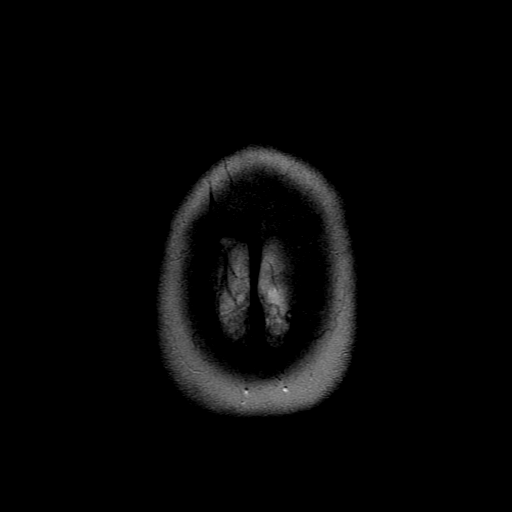

[Series 10: FLAIR · axial · 5.0mm · 0.43mm/px · z∈[-55,+89]mm · 4 of 25 slices shown (2 of 2)]
[im 1/25]
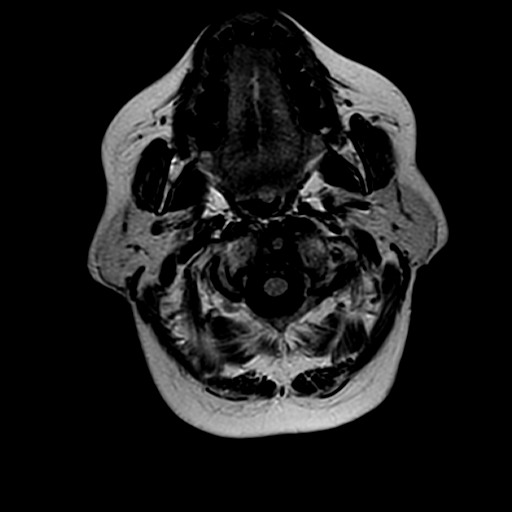
[im 9/25]
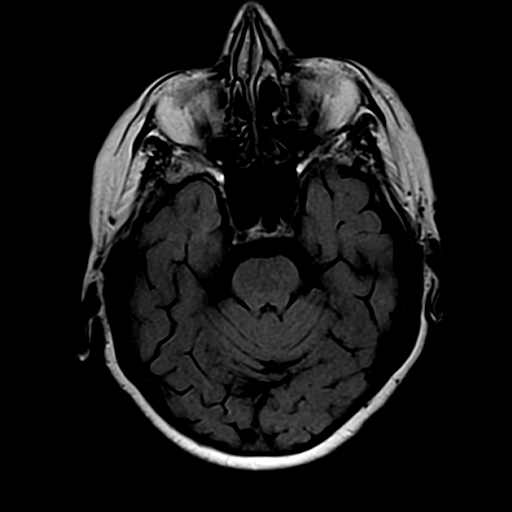
[im 17/25]
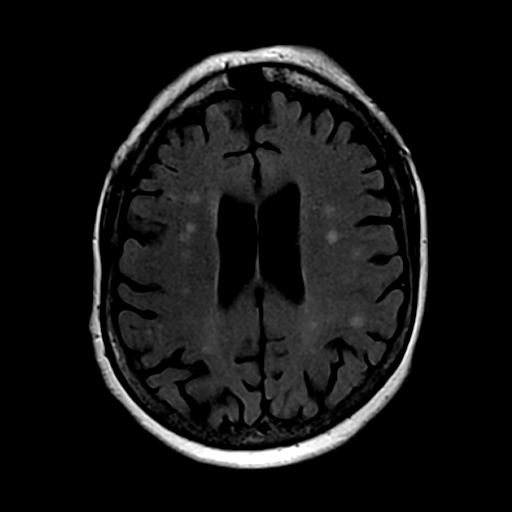
[im 25/25]
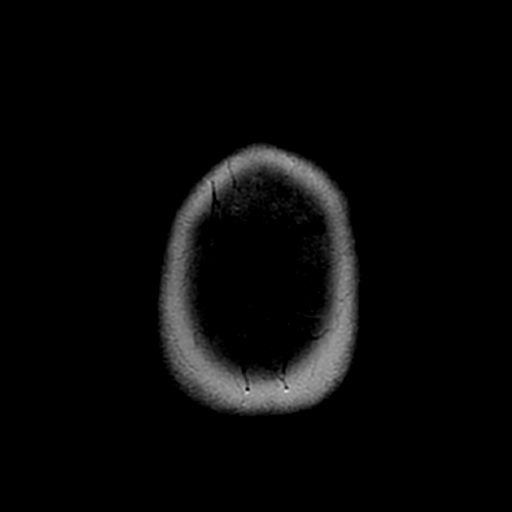

[Series 11: T1 · axial · 5.0mm · 0.43mm/px · z∈[-55,+41]mm · 3 of 25 slices shown]
[im 1/25]
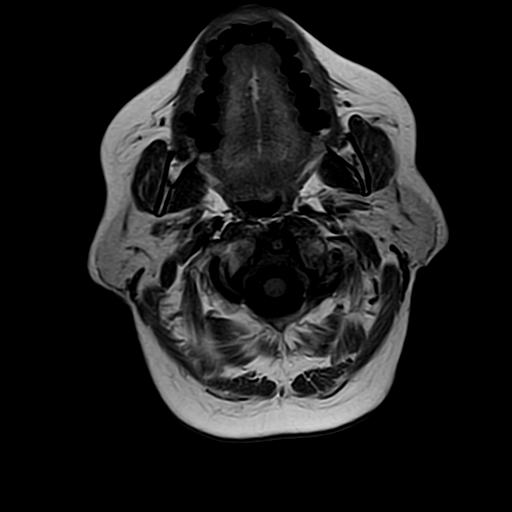
[im 9/25]
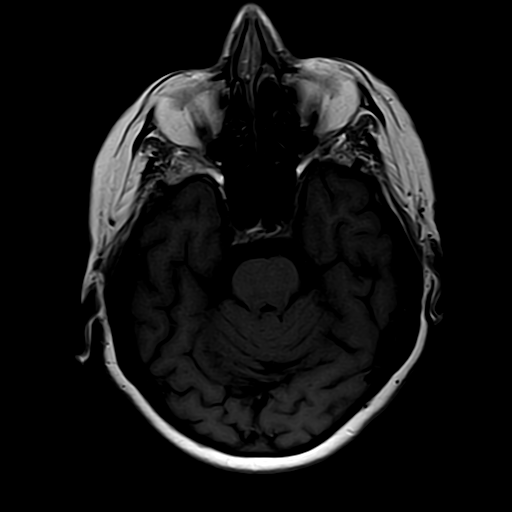
[im 17/25]
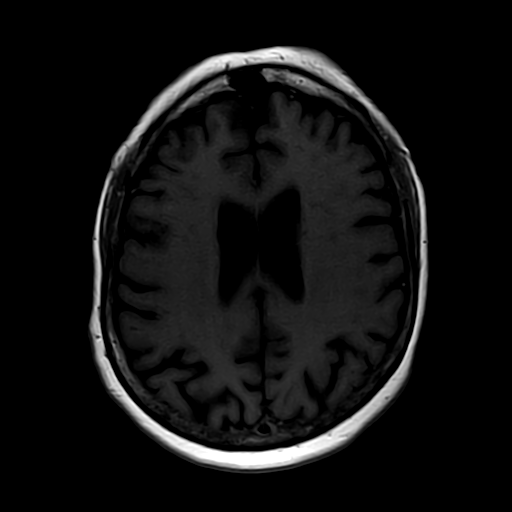

[Series 13: T2 · coronal · 6.0mm · 0.43mm/px · 4 of 24 slices shown (2 of 2)]
[im 1/24]
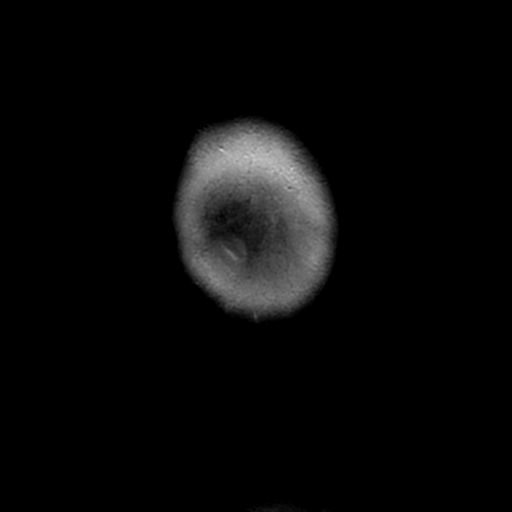
[im 8/24]
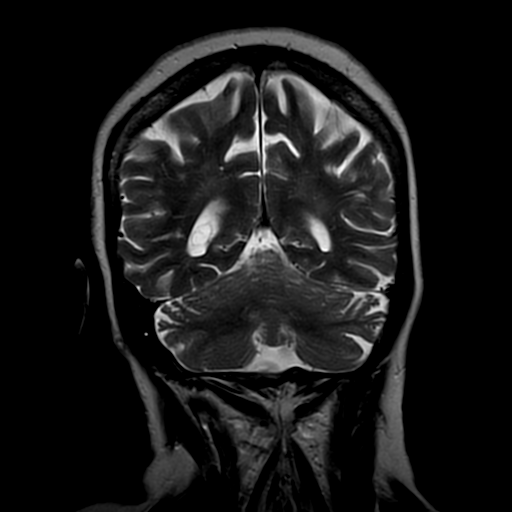
[im 16/24]
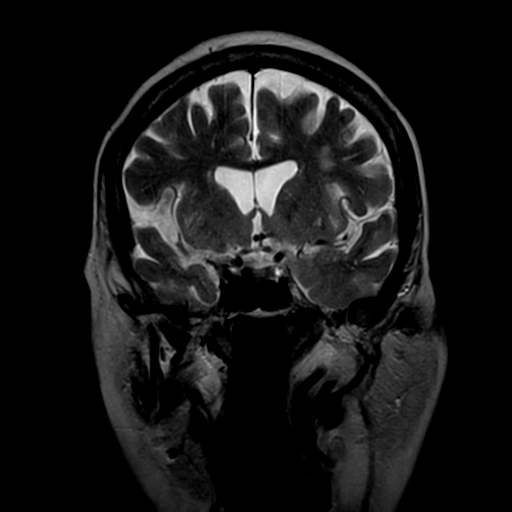
[im 24/24]
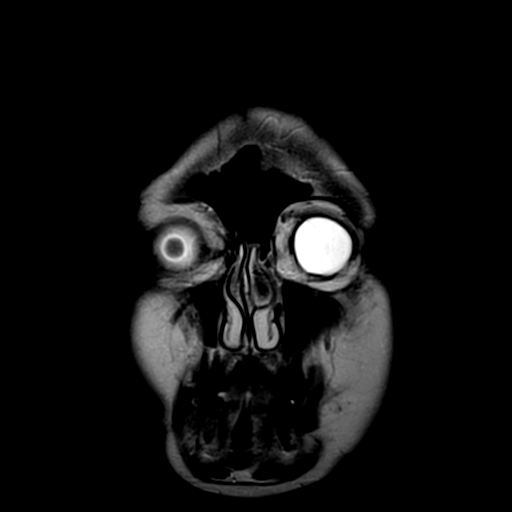

[37 of 48 positions shown; findings below may reference images not displayed]

FINDINGS: Ventricular and sulcal size is normal for the patient's age. There are mild chronic small vessel ischemic changes. There is no definite evidence of normal pressure hydrocephalus There is no mass effect, midline shift or intracranial hemorrhage. There is no evidence of acute infarction or prior microhemorrhages. Skull base flow voids and basal cisterns are patent. Sagittal survey of midline structures is unremarkable. There are no extra-axial fluid collections. Visualized paranasal sinuses, mastoid air cells and orbital contents are unremarkable.
IMPRESSION: Mild chronic small vessel ischemic changes, no acute intracranial abnormality.

## 2021-01-23 IMAGING — MR MRI FOOT LT WO CONTRAST
5 of 9 series · 19 of 40 positions shown · IV contrast (gadolinium)
Comparison: Radiographs from outside facility dated 01/02/2021.

﻿EXAM:  [id number]   MRI FOOT LT WO CONTRAST
INDICATION: Heel pain and swelling.
TECHNIQUE: Multiplanar multisequential MRI of the left foot was performed without gadolinium contrast.

[Series 5: T1 · sagittal · left · 3.0mm · 0.49mm/px · 5 of 22 slices shown (1 of 4)]
[im 1/22]
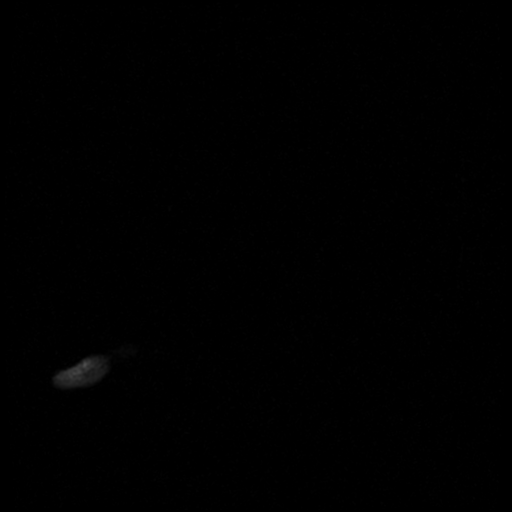
[im 6/22]
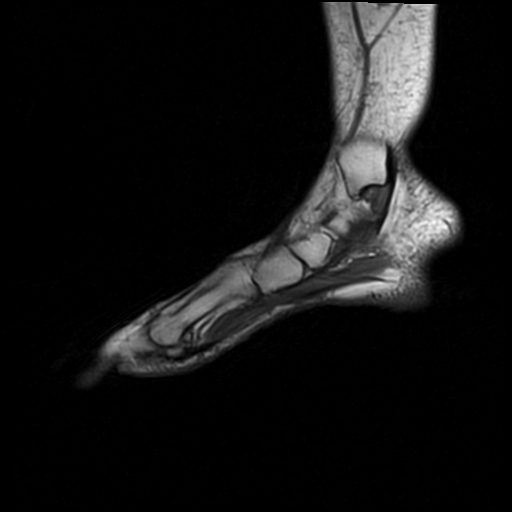
[im 11/22]
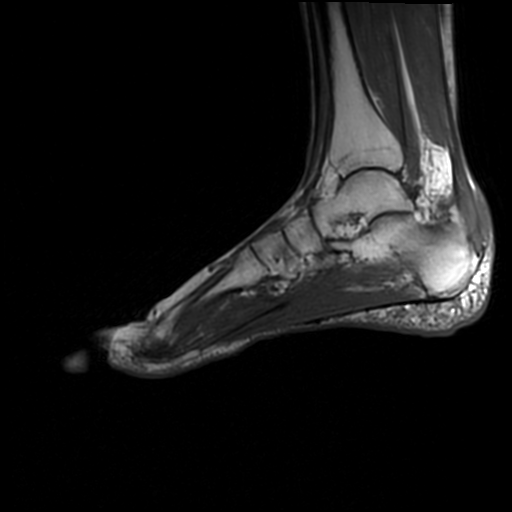
[im 16/22]
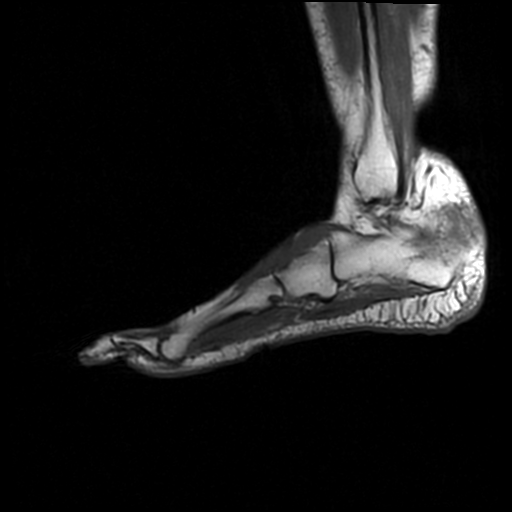
[im 22/22]
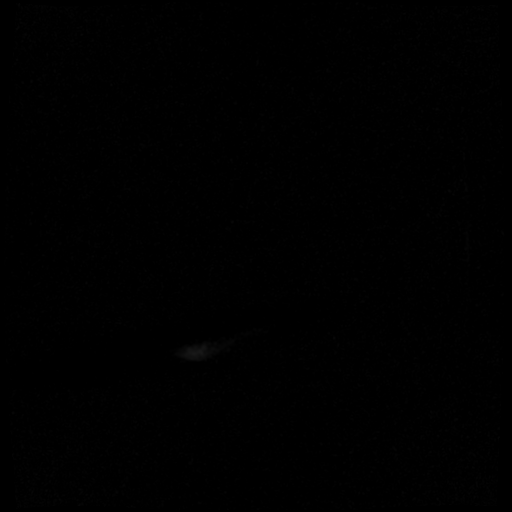

[Series 7: T1 · coronal · left · 5.2mm · 0.29mm/px · 5 of 32 slices shown (2 of 4)]
[im 1/32]
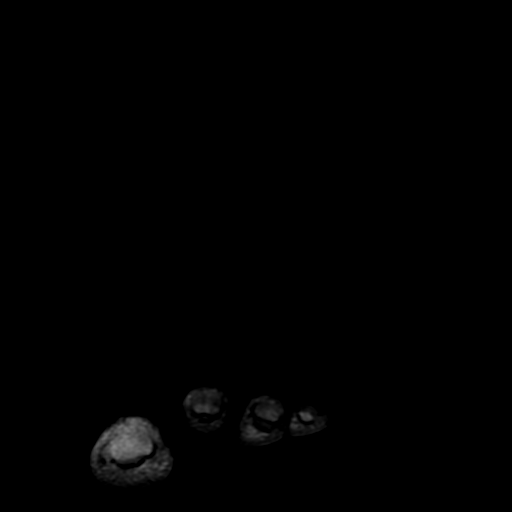
[im 8/32]
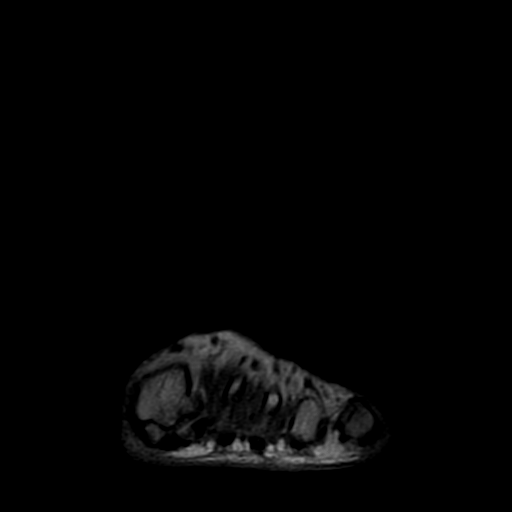
[im 16/32]
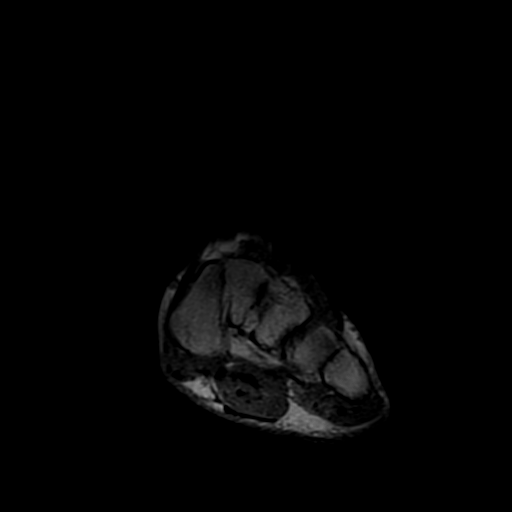
[im 24/32]
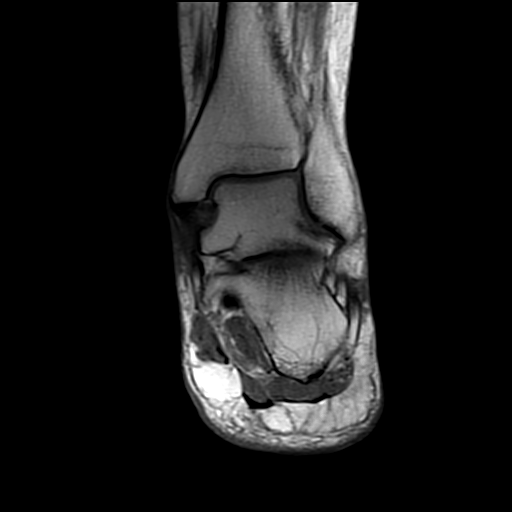
[im 32/32]
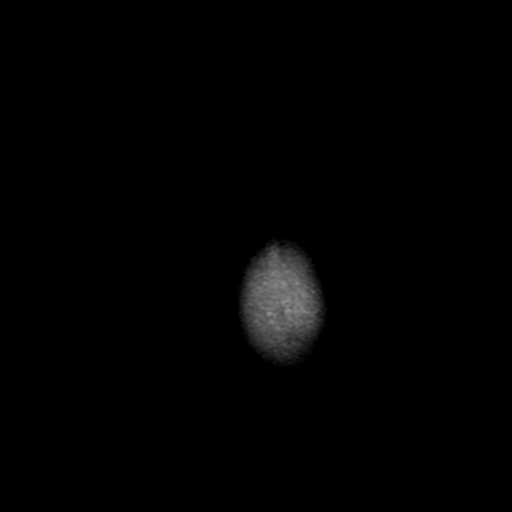

[Series 8: T1 · axial · left · 3.0mm · 0.49mm/px · z∈[-72,+25]mm · 4 of 26 slices shown (3 of 4)]
[im 1/26]
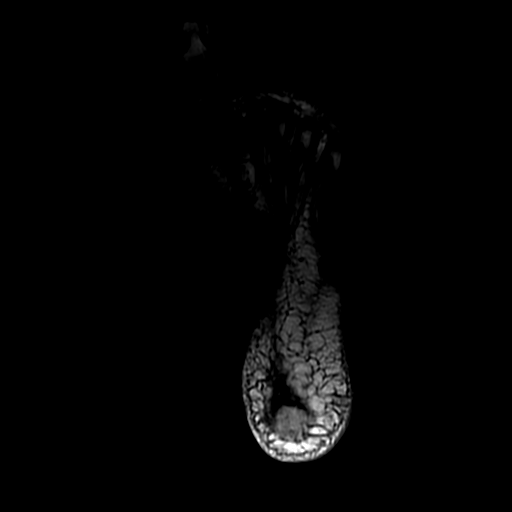
[im 9/26]
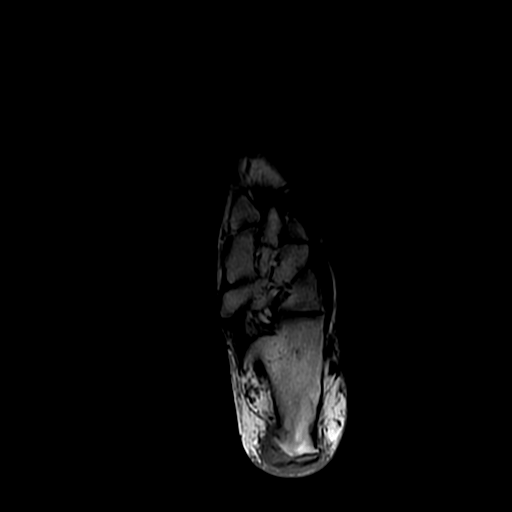
[im 17/26]
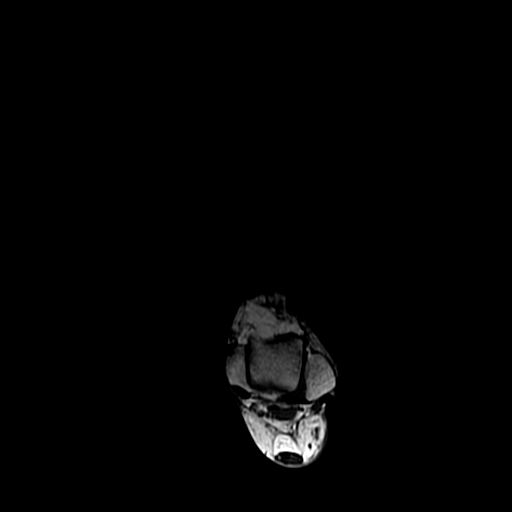
[im 26/26]
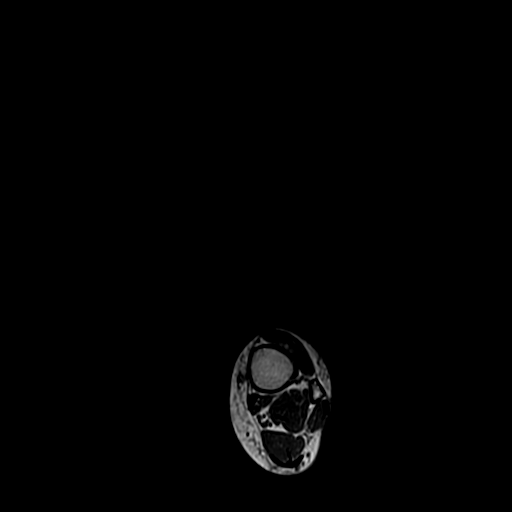

[Series 9: T2 fat-sat · axial · left · 3.0mm · 0.49mm/px · z∈[-72,+25]mm · 4 of 26 slices shown]
[im 1/26]
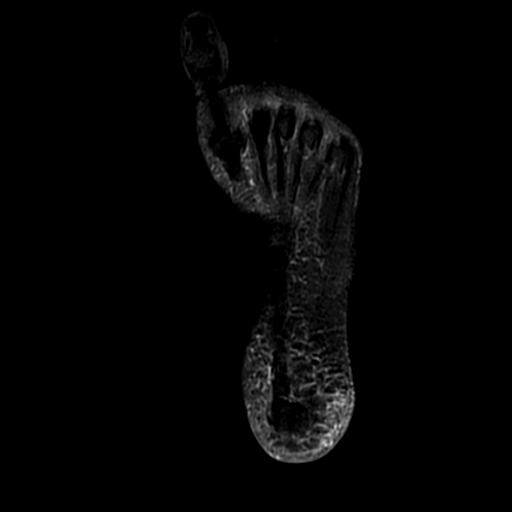
[im 9/26]
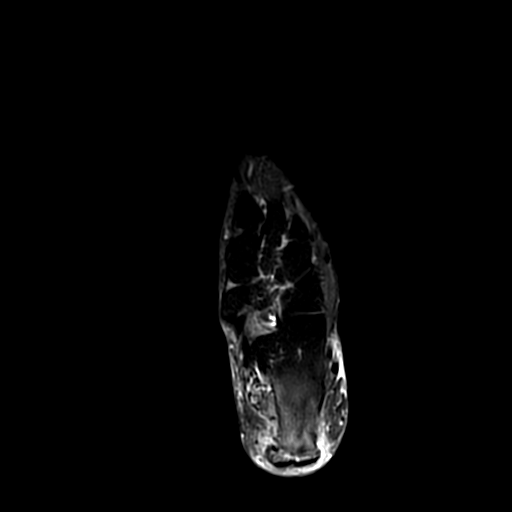
[im 17/26]
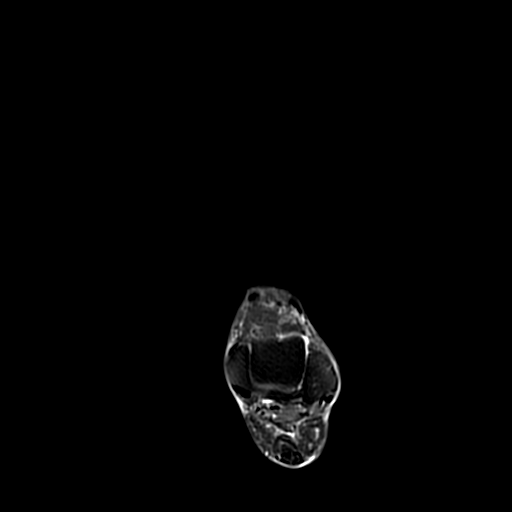
[im 26/26]
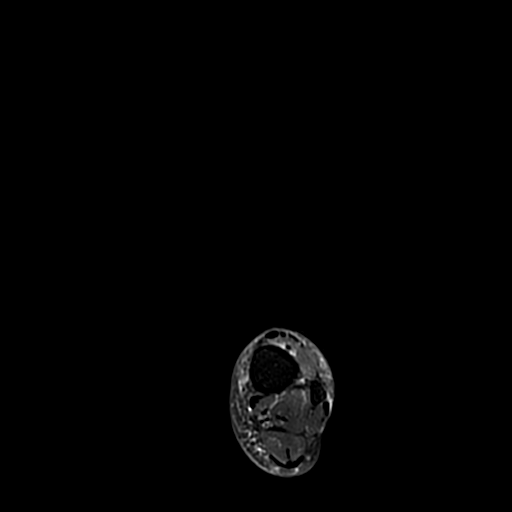

[Series 12: T1 · axial · left · 3.0mm · 0.49mm/px · 1 of 26 slices shown (4 of 4)]
[im 1/26]
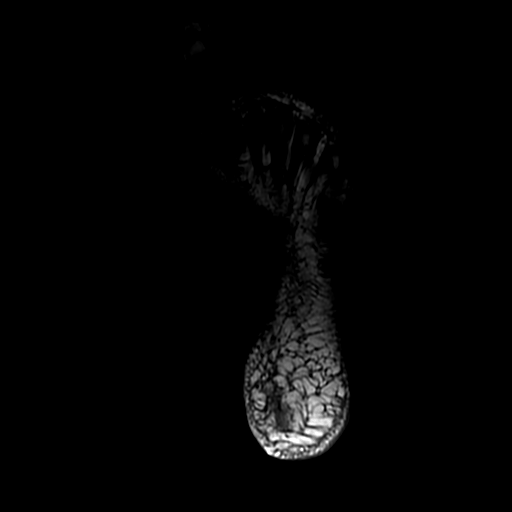

[19 of 40 positions shown; findings below may reference images not displayed]

FINDINGS: Bone marrow signal intensity is normal. There is no acute fracture or subluxation. There is significant edema within the distal Achilles tendon with partial tear at the calcaneal attachment. There is also mild patchy edema within the posterior calcaneus. Small amount of fluid is also noted within the retrocalcaneal bursa compatible with bursitis. There is no osteochondral lesion of the talar dome. Normal T1 signal intensity is noted within the sinus tarsi. Lisfranc ligament is intact. No significant arthritic changes are noted. Visualized flexor, extensor and peroneal tendons are normal without evidence of tenosynovitis, tendon degeneration or tear. Intrinsic muscles of the forefoot are also unremarkable without a definite mass or evidence of denervation atrophy. The plantar aponeurosis is also normal without fasciitis, fibromatosis or tear.
IMPRESSION: 1. Moderate edema within the distal Achilles tendon with partial tear at the calcaneal attachment. 

2. Mild patchy edema within the posterior calcaneus with retrocalcaneal bursitis.

## 2021-06-20 NOTE — ED Provider Notes (Signed)
The following was dictated with voice recognition software and may contain typos. Call [phone] to speak with the author and clarify any incorrect data.    CC and review of nursing and triage notes: cough, congestion    HPI  Starting 06/16/21 the patient developed generalized fatigue, a non-productive cough, nasal congestion, and sore throat. No change in PO intake or activity tolerance. No chest pain (contrary to report of pain with coughing in triage) or peripheral edema. No abdominal pain or fever. No dysuria, hematuria, or increased urinary frequency. Concerned she could have COVID or influenza (and not wanting to give it to her nonagenarian mother whom she cares for) she presented to the ED for testing.     Review of Systems  As per HPI    Medical History  Depression  HLD  GERD  History of kidney stones    Surgical History  Tonsillectomy    Family History  No pertinent family history    Social History  Does not abuse tobacco, alcohol, or illegal drugs    Medications  Valacyclovir  Ropinirole, escitalopram, trazodone, chlorzoxazone, venlafaxine, donepezil, memantine  Simvastatin  Esomeprazole  Tolterodine  Raloxifene    Allergies  Amoxicillin, sulfa    Physical Exam  Patient Vitals for the past 24 hrs:   BP Temp Pulse Resp SpO2 Height Weight   06/20/21 1531 (!) 150/67 99.5 F (37.5 C) 107 22 95 % 1.575 m (5\' 2" ) 62.6 kg (138 lb)     Vital sign interpretation: Hypertension, tachycardia, tachypnea    Constitutional: Normal stature and development, appears stated age.    Psychiatric: alert, oriented, pleasant, cooperative    Dermatologic: warm, well-perfused, dry, normal turgor, no rash, no nail pitting or hypertrophy    Ophthalmic: sclerae white, conjunctiva pink, pupils equal round and reactive to light, visual acuity grossly normal    Respiratory: Normal bilateral breath sounds, no accessory muscle use, normal rate and depth.    Cardiovascular: S1, S2, no murmurs, rubs, or clicks. Equal pulses, no  edema.    GI: oral mucosa well-hydrated, no ulcers in the mouth, abdominal contour protuberant but not distended, normal bowel sounds, soft, non-tender    Neurologic: normal facial expressions, normal muscle tone and strength, steady gait, light touch intact    Labs  Results for orders placed or performed during the hospital encounter of 06/20/21   SARS COV 2, INFLU. A/B, RSV PCR (COFRP)    Specimen: NASOPHARYNX   Result Value Ref Range    Specimen Nasopharynx     SARS-CoV-2 Not Detected Not Detected    Interpretative Comment       This test has been authorized by FDA under an Emergency Use  Authorization (EUA) for use by authorized laboratories. Please review the "Fact  Sheets" for health care providers, and patients and the FDA authorized labeling  available on the [facility] website: www.[facility].com/Covid19.      Influenza A PCR Not Detected Not Detected    Influenza B PCR Not Detected Not Detected    Respiratory Syncytial Virus PCR Not Detected Not Detected    First test Not given     Employed In Healthcare No     Symptomatic as defined by CDC Not given     Date of symptom onset Not given     Hospitalized Not given     ICU Not given     Resident in a congregate care setting No     Is patient pregnant Not given  Race White or Caucasian     Ethnicity       Non  Hispanic  Lab studies performed by: [employer] located at [facility], [address]  [place], [place] [address]         ED Course  Following exam and negative testing for COVID, patient discharged to continue supportive measures for a viral syndrome.    MDM  Viral upper respiratory infection  - COVID, influenza, and RSV testing pending  - Pt discharged home to follow up with PCP    Disposition  Discharge    Condition: Stable

## 2022-03-24 ENCOUNTER — Other Ambulatory Visit (HOSPITAL_COMMUNITY): Payer: Self-pay | Admitting: Family Medicine

## 2022-03-24 DIAGNOSIS — R079 Chest pain, unspecified: Secondary | ICD-10-CM

## 2022-03-27 ENCOUNTER — Inpatient Hospital Stay
Admission: RE | Admit: 2022-03-27 | Discharge: 2022-03-27 | Disposition: A | Discharge status: Home / Self Care | Payer: Medicare Other | Source: Ambulatory Visit | Attending: Family Medicine | Admitting: Family Medicine

## 2022-03-27 ENCOUNTER — Other Ambulatory Visit: Payer: Self-pay

## 2022-03-27 ENCOUNTER — Inpatient Hospital Stay (HOSPITAL_COMMUNITY)
Admission: RE | Admit: 2022-03-27 | Discharge: 2022-03-27 | Disposition: A | Discharge status: Home / Self Care | Payer: Medicare Other | Source: Ambulatory Visit | Attending: Family Medicine | Admitting: Family Medicine

## 2022-03-27 DIAGNOSIS — R9439 Abnormal result of other cardiovascular function study: Secondary | ICD-10-CM | POA: Insufficient documentation

## 2022-03-27 DIAGNOSIS — I447 Left bundle-branch block, unspecified: Secondary | ICD-10-CM | POA: Insufficient documentation

## 2022-03-27 DIAGNOSIS — R079 Chest pain, unspecified: Secondary | ICD-10-CM | POA: Insufficient documentation

## 2022-03-27 MED ORDER — REGADENOSON 0.4 MG/5 ML INTRAVENOUS SYRINGE
0.4000 mg | INJECTION | Freq: Once | INTRAVENOUS | Status: AC
Start: 2022-03-27 — End: 2022-03-27
  Administered 2022-03-27: 0.4 mg via INTRAVENOUS
  Filled 2022-03-27: qty 5

## 2022-03-28 ENCOUNTER — Inpatient Hospital Stay
Admission: RE | Admit: 2022-03-28 | Discharge: 2022-03-28 | Disposition: A | Discharge status: Home / Self Care | Payer: Medicare Other | Source: Ambulatory Visit | Attending: Family Medicine | Admitting: Family Medicine

## 2022-03-28 ENCOUNTER — Inpatient Hospital Stay (HOSPITAL_COMMUNITY)
Admission: RE | Admit: 2022-03-28 | Discharge: 2022-03-28 | Disposition: A | Discharge status: Home / Self Care | Payer: Medicare Other | Source: Ambulatory Visit

## 2022-03-28 DIAGNOSIS — R9439 Abnormal result of other cardiovascular function study: Secondary | ICD-10-CM

## 2022-03-28 HISTORY — DX: Abnormal result of other cardiovascular function study: R94.39

## 2022-04-01 ENCOUNTER — Encounter (INDEPENDENT_AMBULATORY_CARE_PROVIDER_SITE_OTHER): Payer: Self-pay | Admitting: INTERVENTIONAL CARDIOLOGY

## 2022-04-09 ENCOUNTER — Other Ambulatory Visit: Payer: Self-pay

## 2022-04-09 ENCOUNTER — Ambulatory Visit (INDEPENDENT_AMBULATORY_CARE_PROVIDER_SITE_OTHER): Payer: Medicare Other | Admitting: NURSE PRACTITIONER

## 2022-04-09 ENCOUNTER — Encounter (INDEPENDENT_AMBULATORY_CARE_PROVIDER_SITE_OTHER): Payer: Self-pay | Admitting: NURSE PRACTITIONER

## 2022-04-09 VITALS — BP 138/74 | HR 88 | Ht 62.0 in | Wt 134.0 lb

## 2022-04-09 DIAGNOSIS — R9439 Abnormal result of other cardiovascular function study: Secondary | ICD-10-CM

## 2022-04-09 DIAGNOSIS — I10 Essential (primary) hypertension: Secondary | ICD-10-CM

## 2022-04-09 DIAGNOSIS — I447 Left bundle-branch block, unspecified: Secondary | ICD-10-CM

## 2022-04-09 DIAGNOSIS — R011 Cardiac murmur, unspecified: Secondary | ICD-10-CM

## 2022-04-09 DIAGNOSIS — R0789 Other chest pain: Secondary | ICD-10-CM

## 2022-04-09 DIAGNOSIS — E782 Mixed hyperlipidemia: Secondary | ICD-10-CM

## 2022-04-09 MED ORDER — METOPROLOL SUCCINATE ER 25 MG TABLET,EXTENDED RELEASE 24 HR
25.0000 mg | ORAL_TABLET | Freq: Every day | ORAL | 4 refills | Status: DC
Start: 2022-04-09 — End: 2022-04-09

## 2022-04-09 MED ORDER — METOPROLOL SUCCINATE ER 25 MG TABLET,EXTENDED RELEASE 24 HR
25.0000 mg | ORAL_TABLET | Freq: Every day | ORAL | 4 refills | Status: DC
Start: 2022-04-09 — End: 2022-07-16

## 2022-04-09 NOTE — Progress Notes (Signed)
Cardiology Clinic [facility]    Name: Deanna Flowers  Age: 71 y.o.  Date of Service: 04/09/2022    Primary Care Provider: Emilia Beck, DO  Chief Complaint:   Chief Complaint   Patient presents with    Establish Care     Abnormal Stress Test     Hypertension    Hyperlipidemia     Left Bundle Branch        HPI:   Deanna Flowers is a very pleasant 71 y.o. female with a past medical history significant for hypertension, hyperlipidemia, left bundle-branch block, and anxiety. She was also diagnosed with dementia approximately 4 years ago.    04/09/2022: The patient presents to establish care for c/o chest pain and 03/2022 [facility] abnormal moderate risk stress test. This study shows global hypokinesia especially of the anteroapical region with a EF 48%.  She reports the chest pain symptoms come on suddenly, she feels hot and dizzy and then will have cold chills . The symptoms have occurred proximally 5 times over the past year.  She is unable to describe in more detail. She is a/o x 3 however is forgetful. She does tell me that stress may be part of the problem.    Past Medical History:  Past Medical History:   Diagnosis Date    Abnormal cardiovascular stress test 03/28/2022    [place] [facility]    Esophageal reflux     Essential hypertension     Herpes simplex     Memory impairment     Mixed hyperlipidemia     Osteoporosis     Restless leg     Vitamin D deficiency          Social History:  Social History     Tobacco Use   Smoking Status Never   Smokeless Tobacco Never      Social History     Substance and Sexual Activity   Alcohol Use Never      Social History     Substance and Sexual Activity   Drug Use Never      Current Medications:  Current Outpatient Medications   Medication Sig    buPROPion (WELLBUTRIN XL) 300 mg extended release 24 hr tablet 1 Tablet (300 mg total) Once a day    chlorzoxazone (PARAFON FORTE) 500 mg Oral Tablet Take 1 Tablet (500 mg total) by mouth Every 8 hours as needed    DETROL LA 4 mg  Oral Capsule, Sust. Release 24 hr 1 Capsule (4 mg total) Every night    donepeziL (ARICEPT) 10 mg Oral Tablet 1 Tablet (10 mg total) Every night    memantine (NAMENDA) 10 mg Oral Tablet 1 Tablet (10 mg total) Twice daily    metoprolol succinate (TOPROL-XL) 25 mg Oral Tablet Sustained Release 24 hr Take 1 Tablet (25 mg total) by mouth Once a day    pantoprazole (PROTONIX) 40 mg Oral Tablet, Delayed Release (E.C.) 1 Tablet (40 mg total) Once a day    raloxifene (EVISTA) 60 mg Oral Tablet 1 Tablet (60 mg total) Once a day    rOPINIRole (REQUIP) 1 mg Oral Tablet 1 Tablet (1 mg total) Twice daily    simvastatin (ZOCOR) 20 mg Oral Tablet Take 1 Tablet (20 mg total) by mouth Every evening    valACYclovir (VALTREX) 500 mg Oral Tablet 1 Tablet (500 mg total) Once a day    venlafaxine (EFFEXOR XR) 75 mg Oral Capsule, Sust. Release 24 hr Every night  Allergies:  Allergies   Allergen Reactions    Penicillins     Sulfa (Sulfonamides)       Review of Systems:  Complete ROS was performed and otherwise negative unless noted in HPI.      Vital Signs:  Vitals:    04/09/22 1354   BP: 138/74   Pulse: 88   SpO2: 96%   Weight: 60.8 kg (134 lb)   Height: 1.575 m (5\' 2" )   BMI: 24.56      Physical Exam:  General: Pt resting comfortably in no acute distress and appears stated age.    Neck: No JVD, no carotid bruit. Neck supple, symmetrical, trachea midline.   Lungs:  Normal respiratory effort, lungs clear to auscultation bilaterally.    Cardiovascular: Regular rate and rhythm. Normal S1 S2. Systolic murmur. No rub. No gallop. No thrill. Vascular pulses are 2+ throughout.  Abdomen: Soft, non-tender  Extremities: Extremities normal, atraumatic, no cyanosis or edema.    Neurologic: Alert and oriented x3.     Previous Studies:    Assessment:Hypertension, unspecified type    Left bundle branch block    Murmur, cardiac    Mixed hyperlipidemia    Abnormal stress test    Atypical chest pain    Plan:  Given patient's symptoms although atypical  ,she does have history of chronic left bundle-branch block as well as recent abnormal moderate risk stress testing.  Cardiac catheterization advised to evaluative for ischemia. Patient wishes to proceed, her brother is with her, and is agreeable as well.  Murmur noted on exam no prior evaluation, a echocardiogram to be completed.  She should continue her aspirin and statin therapy. We will start Toprol 25 mg once daily as prescribed.  Follow-up after procedure.      Orders placed this visit:  Orders Placed This Encounter    EKG (In-Clinic Today)    TRANSTHORACIC ECHOCARDIOGRAM - ADULT    metoprolol succinate (TOPROL-XL) 25 mg Oral Tablet Sustained Release 24 hr       MYOSHA CUADRAS is to return to clinic for follow up with the understanding that should symptoms change or worsen she is to call the office or go to the closest emergency department for evaluation.    Aiyannah Fayad, FNP-C    A portion of this documentation may have been generated using MMODAL voice recognition software and may contain syntax/voice recognition errors.

## 2022-04-11 LAB — ECG W INTERP (AMB USE ONLY)(MUSE,IN CLINIC)
Atrial Rate: 85 {beats}/min
Calculated P Axis: 61 degrees
Calculated R Axis: -70 degrees
Calculated T Axis: 98 degrees
PR Interval: 128 ms
QRS Duration: 146 ms
QT Interval: 438 ms
QTC Calculation: 521 ms
Ventricular rate: 85 {beats}/min

## 2022-04-15 ENCOUNTER — Telehealth (INDEPENDENT_AMBULATORY_CARE_PROVIDER_SITE_OTHER): Payer: Self-pay | Admitting: INTERVENTIONAL CARDIOLOGY

## 2022-04-15 NOTE — Telephone Encounter (Signed)
Mr. Langston Masker called and informed of pts ECHO on Sept 29,2023 at 100 pm at [facility]. He voiced understanding.

## 2022-05-02 ENCOUNTER — Other Ambulatory Visit: Payer: Self-pay

## 2022-05-02 ENCOUNTER — Inpatient Hospital Stay
Admission: RE | Admit: 2022-05-02 | Discharge: 2022-05-02 | Disposition: A | Discharge status: Home / Self Care | Payer: Medicare Other | Source: Ambulatory Visit | Attending: NURSE PRACTITIONER | Admitting: NURSE PRACTITIONER

## 2022-05-02 DIAGNOSIS — I447 Left bundle-branch block, unspecified: Secondary | ICD-10-CM | POA: Insufficient documentation

## 2022-05-02 DIAGNOSIS — R011 Cardiac murmur, unspecified: Secondary | ICD-10-CM | POA: Insufficient documentation

## 2022-05-02 DIAGNOSIS — I10 Essential (primary) hypertension: Secondary | ICD-10-CM | POA: Insufficient documentation

## 2022-05-02 MED ORDER — SODIUM CHLORIDE 0.9 % INJECTION SOLUTION
2.0000 mL | Freq: Once | INTRAVENOUS | Status: DC | PRN
Start: 2022-05-02 — End: 2022-05-03
  Administered 2022-05-02: 4 mL via INTRAVENOUS

## 2022-06-02 DIAGNOSIS — R011 Cardiac murmur, unspecified: Secondary | ICD-10-CM

## 2022-06-02 DIAGNOSIS — I447 Left bundle-branch block, unspecified: Secondary | ICD-10-CM

## 2022-06-02 DIAGNOSIS — I10 Essential (primary) hypertension: Secondary | ICD-10-CM

## 2022-06-25 ENCOUNTER — Telehealth (INDEPENDENT_AMBULATORY_CARE_PROVIDER_SITE_OTHER): Payer: Self-pay | Admitting: INTERVENTIONAL CARDIOLOGY

## 2022-06-25 NOTE — Telephone Encounter (Signed)
Called Deanna Flowers and scheduled pt for Heart Cath on Dec.13,2023. Cath lab will call day before and let him know a time. He stated that pt is not on any blood thinners other than Baby Aspirin. He voiced understanding.

## 2022-07-16 ENCOUNTER — Encounter (HOSPITAL_COMMUNITY): Payer: Self-pay | Admitting: INTERVENTIONAL CARDIOLOGY

## 2022-07-16 ENCOUNTER — Encounter (HOSPITAL_COMMUNITY)
Admission: RE | Disposition: A | Discharge status: Home / Self Care | Payer: Self-pay | Source: Ambulatory Visit | Attending: INTERVENTIONAL CARDIOLOGY

## 2022-07-16 ENCOUNTER — Inpatient Hospital Stay
Admission: RE | Admit: 2022-07-16 | Discharge: 2022-07-16 | Disposition: A | Discharge status: Home / Self Care | Payer: Medicare Other | Source: Ambulatory Visit | Attending: INTERVENTIONAL CARDIOLOGY | Admitting: INTERVENTIONAL CARDIOLOGY

## 2022-07-16 ENCOUNTER — Other Ambulatory Visit: Payer: Self-pay

## 2022-07-16 DIAGNOSIS — E782 Mixed hyperlipidemia: Secondary | ICD-10-CM | POA: Insufficient documentation

## 2022-07-16 DIAGNOSIS — I10 Essential (primary) hypertension: Secondary | ICD-10-CM | POA: Insufficient documentation

## 2022-07-16 DIAGNOSIS — I428 Other cardiomyopathies: Secondary | ICD-10-CM

## 2022-07-16 DIAGNOSIS — I447 Left bundle-branch block, unspecified: Secondary | ICD-10-CM | POA: Insufficient documentation

## 2022-07-16 DIAGNOSIS — R9439 Abnormal result of other cardiovascular function study: Secondary | ICD-10-CM | POA: Insufficient documentation

## 2022-07-16 DIAGNOSIS — R079 Chest pain, unspecified: Secondary | ICD-10-CM | POA: Insufficient documentation

## 2022-07-16 LAB — CBC WITH DIFF
BASOPHIL #: 0.1 10*3/uL (ref 0.00–0.10)
BASOPHIL %: 1 % (ref 0–1)
EOSINOPHIL #: 0.2 10*3/uL (ref 0.00–0.50)
EOSINOPHIL %: 2 %
HCT: 38 % (ref 31.2–41.9)
HGB: 12.2 g/dL (ref 10.9–14.3)
LYMPHOCYTE #: 1.8 10*3/uL (ref 1.00–3.00)
LYMPHOCYTE %: 25 % (ref 16–44)
MCH: 27.3 pg (ref 24.7–32.8)
MCHC: 32 g/dL — ABNORMAL LOW (ref 32.3–35.6)
MCV: 85.2 fL (ref 75.5–95.3)
MONOCYTE #: 0.6 10*3/uL (ref 0.30–1.00)
MONOCYTE %: 8 % (ref 5–13)
MPV: 8.6 fL (ref 7.9–10.8)
NEUTROPHIL #: 4.8 10*3/uL (ref 1.85–7.80)
NEUTROPHIL %: 64 % (ref 43–77)
PLATELETS: 294 10*3/uL (ref 140–440)
RBC: 4.46 10*6/uL (ref 3.63–4.92)
RDW: 16.4 % (ref 12.3–17.7)
WBC: 7.4 10*3/uL (ref 3.8–11.8)

## 2022-07-16 LAB — BASIC METABOLIC PANEL
ANION GAP: 10 mmol/L (ref 4–13)
BUN/CREA RATIO: 19 (ref 6–22)
BUN: 13 mg/dL (ref 7–25)
CALCIUM: 8.7 mg/dL (ref 8.6–10.3)
CHLORIDE: 109 mmol/L — ABNORMAL HIGH (ref 98–107)
CO2 TOTAL: 24 mmol/L (ref 21–31)
CREATININE: 0.68 mg/dL (ref 0.60–1.30)
ESTIMATED GFR: 93 mL/min/{1.73_m2} (ref >59)
GLUCOSE: 91 mg/dL (ref 74–109)
OSMOLALITY, CALCULATED: 285 mOsm/kg (ref 270–290)
POTASSIUM: 3.3 mmol/L — ABNORMAL LOW (ref 3.5–5.1)
SODIUM: 143 mmol/L (ref 136–145)

## 2022-07-16 LAB — PT/INR
INR: 0.98 (ref 0.84–1.10)
PROTHROMBIN TIME: 11.4 seconds (ref 9.8–12.7)

## 2022-07-16 LAB — PTT (PARTIAL THROMBOPLASTIN TIME): APTT: 30.2 seconds (ref 26.0–36.0)

## 2022-07-16 LAB — MAGNESIUM: MAGNESIUM: 2 mg/dL (ref 1.9–2.7)

## 2022-07-16 LAB — INVASIVE CARDIOLOGY PROCEDURE: Cath EF Quantitative: 30 %

## 2022-07-16 SURGERY — CORONARY ANGIOGRAPHY W/LEFT HEART CATH W/WO LVG
Anesthesia: IV Sedation (Nurse Monitored)

## 2022-07-16 MED ORDER — NITROGLYCERIN 100 MCG/ML IN NS INJECTION
INJECTION | Freq: Once | INTRAVENOUS | Status: DC | PRN
Start: 2022-07-16 — End: 2022-07-16
  Administered 2022-07-16: 200 ug via INTRA_ARTERIAL

## 2022-07-16 MED ORDER — DIAZEPAM 5 MG TABLET
ORAL_TABLET | ORAL | Status: AC
Start: 2022-07-16 — End: 2022-07-16
  Filled 2022-07-16: qty 1

## 2022-07-16 MED ORDER — FENTANYL (PF) 50 MCG/ML INJECTION WRAPPER
INJECTION | Freq: Once | INTRAMUSCULAR | Status: DC | PRN
Start: 2022-07-16 — End: 2022-07-16
  Administered 2022-07-16 (×4): 25 ug via INTRAVENOUS

## 2022-07-16 MED ORDER — LIDOCAINE HCL 10 MG/ML (1 %) INJECTION SOLUTION
INTRAMUSCULAR | Status: AC
Start: 2022-07-16 — End: 2022-07-16
  Filled 2022-07-16: qty 50

## 2022-07-16 MED ORDER — VERAPAMIL 2.5 MG/ML INTRAVENOUS SOLUTION
INTRAVENOUS | Status: AC
Start: 2022-07-16 — End: 2022-07-16
  Filled 2022-07-16: qty 2

## 2022-07-16 MED ORDER — VERAPAMIL 2.5 MG/ML INTRAVENOUS SOLUTION
Freq: Once | INTRAVENOUS | Status: DC | PRN
Start: 2022-07-16 — End: 2022-07-16
  Administered 2022-07-16: 5 mg via INTRA_ARTERIAL

## 2022-07-16 MED ORDER — DIAZEPAM 5 MG TABLET
5.0000 mg | ORAL_TABLET | ORAL | Status: AC
Start: 2022-07-16 — End: 2022-07-16
  Administered 2022-07-16: 5 mg via ORAL

## 2022-07-16 MED ORDER — IOHEXOL 350 MG IODINE/ML INTRAVENOUS SOLUTION
Freq: Once | INTRAVENOUS | Status: DC | PRN
Start: 2022-07-16 — End: 2022-07-16
  Administered 2022-07-16: 60 mL via INTRA_ARTERIAL

## 2022-07-16 MED ORDER — HEPARIN (PORCINE) 1,000 UNIT/ML INJECTION SOLUTION
Freq: Once | INTRAMUSCULAR | Status: DC | PRN
Start: 2022-07-16 — End: 2022-07-16
  Administered 2022-07-16: 3100 [IU] via INTRAVENOUS

## 2022-07-16 MED ORDER — ASPIRIN 81 MG CHEWABLE TABLET
324.0000 mg | CHEWABLE_TABLET | Freq: Once | ORAL | Status: AC
Start: 2022-07-16 — End: 2022-07-16
  Administered 2022-07-16: 324 mg via ORAL

## 2022-07-16 MED ORDER — HEPARIN (PORCINE) (PF) 2,000 UNIT/1,000 ML IN 0.9 % SODIUM CHLORIDE IV
INTRAVENOUS | Status: AC
Start: 2022-07-16 — End: 2022-07-16
  Filled 2022-07-16: qty 2000

## 2022-07-16 MED ORDER — FENTANYL (PF) 50 MCG/ML INJECTION SOLUTION
INTRAMUSCULAR | Status: AC
Start: 2022-07-16 — End: 2022-07-16
  Filled 2022-07-16: qty 2

## 2022-07-16 MED ORDER — POTASSIUM CHLORIDE ER 20 MEQ TABLET,EXTENDED RELEASE(PART/CRYST)
ORAL_TABLET | ORAL | Status: AC
Start: 2022-07-16 — End: 2022-07-16
  Filled 2022-07-16: qty 3

## 2022-07-16 MED ORDER — SODIUM CHLORIDE 0.9 % (FLUSH) INJECTION SYRINGE
3.0000 mL | INJECTION | Freq: Three times a day (TID) | INTRAMUSCULAR | Status: DC
Start: 2022-07-16 — End: 2022-07-16
  Administered 2022-07-16: 10 mL

## 2022-07-16 MED ORDER — POTASSIUM CHLORIDE ER 20 MEQ TABLET,EXTENDED RELEASE(PART/CRYST)
60.0000 meq | ORAL_TABLET | ORAL | Status: AC
Start: 2022-07-16 — End: 2022-07-16
  Administered 2022-07-16: 60 meq via ORAL

## 2022-07-16 MED ORDER — SODIUM CHLORIDE 0.9 % (FLUSH) INJECTION SYRINGE
3.0000 mL | INJECTION | INTRAMUSCULAR | Status: DC | PRN
Start: 2022-07-16 — End: 2022-07-16

## 2022-07-16 MED ORDER — CARVEDILOL 3.125 MG TABLET
3.1250 mg | ORAL_TABLET | Freq: Two times a day (BID) | ORAL | 3 refills | Status: DC
Start: 2022-07-16 — End: 2022-12-05

## 2022-07-16 MED ORDER — VALSARTAN 40 MG TABLET
20.0000 mg | ORAL_TABLET | Freq: Two times a day (BID) | ORAL | 3 refills | Status: DC
Start: 2022-07-16 — End: 2022-09-03

## 2022-07-16 MED ORDER — SODIUM CHLORIDE 0.9 % INTRAVENOUS SOLUTION
INTRAVENOUS | Status: DC
Start: 2022-07-16 — End: 2022-07-16

## 2022-07-16 MED ORDER — ASPIRIN 81 MG CHEWABLE TABLET
CHEWABLE_TABLET | ORAL | Status: AC
Start: 2022-07-16 — End: 2022-07-16
  Filled 2022-07-16: qty 4

## 2022-07-16 MED ORDER — SODIUM CHLORIDE 0.9 % INTRAVENOUS SOLUTION
INTRAVENOUS | Status: AC | PRN
Start: 2022-07-16 — End: 2022-07-16
  Administered 2022-07-16: 50 mL/h via INTRAVENOUS

## 2022-07-16 MED ORDER — ACETAMINOPHEN 325 MG TABLET
975.0000 mg | ORAL_TABLET | Freq: Once | ORAL | Status: DC | PRN
Start: 2022-07-16 — End: 2022-07-16

## 2022-07-16 MED ORDER — DIPHENHYDRAMINE 25 MG CAPSULE
25.0000 mg | ORAL_CAPSULE | ORAL | Status: DC
Start: 2022-07-16 — End: 2022-07-16

## 2022-07-16 MED ORDER — LIDOCAINE HCL 10 MG/ML (1 %) INJECTION SOLUTION
Freq: Once | INTRAMUSCULAR | Status: DC | PRN
Start: 2022-07-16 — End: 2022-07-16
  Administered 2022-07-16: 5 mL via INTRADERMAL

## 2022-07-16 SURGICAL SUPPLY — 10 items
CATH ANGIO 5FR PGTL ST CURVE 110CM PERFORMA MIV 6 SH RADOPQ BRD RADIAL PEBAX PLYCRB NYL STRL LF (CATHETERS) ×1 IMPLANT
CATH ANGIO 5FR RADIAL TIG 4 CURVE 100CM OPTITORQUE LRG LUM SH 2 BRD SFT TIP COR SS NYL POLYUR (CATHETERS) ×1 IMPLANT
DEVICE COMPRESS TR BAND 24CM 2 BAL TRNSPR ADJ STRAP REG RADIAL ART (MED SURG SUPPLIES) ×1 IMPLANT
GW .035IN 260CM ANGIO PTFE FIX COR STD BODY STN FNSH 2 END 3MM J CURVE STRL (WIRE) ×1 IMPLANT
GW GLDWR .035IN 3CM 150CM FLXB ANG TIP RADOPQ KINK RST NITINOL TUNG POLYUR HDRPH VAS STD (WIRE) ×1 IMPLANT
KIT INTROD 10CM 6FR 22GA GLIDESHEATH SLNDR .021IN PLASTIC SHEATH DIL 2 WL PNCT SHORT ANG MINIWIRE 45 (INTRODUCER) ×1 IMPLANT
SCATPAD RAD SHIELD 145 X 165 (MED SURG SUPPLIES) ×1 IMPLANT
SET ADMN MERIT MEDICAL SYS INJ MED CNRST S DISP (VASCULAR) ×1 IMPLANT
SPONGE GAUZE 4X4IN MDCHC COTTON 12 PLY TY 7 LF  STRL DISP (WOUND CARE SUPPLY) ×2 IMPLANT
TUBING PROCEDURE 72IN RADGR STRL DISP (MED SURG SUPPLIES) ×1 IMPLANT

## 2022-07-16 NOTE — Nurses Notes (Addendum)
Discharge instructions, restrictions, and precautions reviewed with patient and her brother; both verbalize understanding. Patient to start on 2 new medications; family instructed which pharmacy meds sent to.  Follow up appointment in January. Right radial access site with clean dry dressing intact; no S/S hematoma, site soft; reminded patient and her brother to hold pressure and get to ER if she starts to bleed or develop a hematoma. Out of facility by wheelchair with all belongings including home medications at 1431

## 2022-07-16 NOTE — H&P (Signed)
[facility]  Cardiology   Consult    Anusha, Claus, 71 y.o. female  Encounter Start Date:  07/16/2022  Inpatient Admission Date:    Date of Service: 07/16/2022  Date of Birth:  1951/02/03  PCP:  Emilia Beck, DO    Information Obtained from: patient and history reviewed via medical record  Chief Complaint: chest pain; abnormal stress test    HPI:  Deanna Flowers is a 71 y.o. female who was referred due to abnormal stress test and chest pain.  She has a history of HTN, hyperlipidemia, left bundle-branch block, anxiety, GERD, and dementia.  She reports episodes of intermittent sudden chest pain and then feeling hot and dizzy, several episodes over the last year.  She had a moderate risk stress test in August of 2023 that showed a moderate degree of ischemia of moderate amount in the anteroseptal wall, EF reduced at 48% with global hypokinesia.  Echocardiogram showed EF 45%.  Recommended for left heart catheterization for further evaluation.    Past Medical History:   Diagnosis Date    Abnormal cardiovascular stress test 03/28/2022    [place] [facility]    Esophageal reflux     Essential hypertension     Herpes simplex     Memory impairment     Mixed hyperlipidemia     Osteoporosis     Restless leg     Vitamin D deficiency      Past Surgical History:  Tonsillectomy        Prior to Admission Medications:  Medications Prior to Admission       Prescriptions    buPROPion (WELLBUTRIN XL) 300 mg extended release 24 hr tablet    1 Tablet (300 mg total) Once a day    chlorzoxazone (PARAFON FORTE) 500 mg Oral Tablet    Take 1 Tablet (500 mg total) by mouth Every 8 hours as needed    DETROL LA 4 mg Oral Capsule, Sust. Release 24 hr    1 Capsule (4 mg total) Every night    donepeziL (ARICEPT) 10 mg Oral Tablet    1 Tablet (10 mg total) Every night    memantine (NAMENDA) 10 mg Oral Tablet    1 Tablet (10 mg total) Twice daily    metoprolol succinate (TOPROL-XL) 25 mg Oral Tablet Sustained Release 24 hr     Take 1 Tablet (25 mg total) by mouth Once a day    pantoprazole (PROTONIX) 40 mg Oral Tablet, Delayed Release (E.C.)    1 Tablet (40 mg total) Once a day    raloxifene (EVISTA) 60 mg Oral Tablet    1 Tablet (60 mg total) Once a day    rOPINIRole (REQUIP) 1 mg Oral Tablet    1 Tablet (1 mg total) Twice daily    simvastatin (ZOCOR) 20 mg Oral Tablet    Take 1 Tablet (20 mg total) by mouth Every evening    valACYclovir (VALTREX) 500 mg Oral Tablet    1 Tablet (500 mg total) Once a day    venlafaxine (EFFEXOR XR) 75 mg Oral Capsule, Sust. Release 24 hr    Every night          Allergies   Allergen Reactions    Penicillins     Sulfa (Sulfonamides)        Family History:  Family Medical History:       Problem Relation (Age of Onset)    Diabetes Brother    Hypertension (High Blood  Pressure) Mother    Prostate Cancer Father          ROS: Other than ROS in the HPI, all other systems were negative.   Exam:  General: No acute distress and appears stated age.    HEENT:Head normocephalic, atraumatic. ENT without erythema or injection, mucouse membranes moist.    Neck: No JVD, no carotid bruit. and supple, symmetrical, trachea midline.   Lungs: Clear to auscultation bilaterally.    Cardiovascular:  Regular rate and rhythm.  Normal S1 and S2 without murmur, gallop, or rub.  Abdomen: Soft, non-tender and bowel sounds normal.    Extremities: Extremities normal, atraumatic, no cyanosis or edema.    Skin: Skin warm and dry.    Neurologic: Alert and oriented x3.  Psych: Mood and affect congruent for age and gender     Diagnosis: abnormal stress test; atypical angina    Plan: Left heart catheterization with possible PCI      Amedeo Kinsman, APRN,FNP-BC 07/16/2022 08:23

## 2022-07-23 DIAGNOSIS — I447 Left bundle-branch block, unspecified: Secondary | ICD-10-CM

## 2022-07-23 DIAGNOSIS — I509 Heart failure, unspecified: Secondary | ICD-10-CM

## 2022-07-23 LAB — ECG 12 LEAD
Atrial Rate: 77 {beats}/min
Calculated P Axis: 45 degrees
Calculated R Axis: -52 degrees
Calculated T Axis: 101 degrees
PR Interval: 148 ms
QRS Duration: 150 ms
QT Interval: 484 ms
QTC Calculation: 547 ms
Ventricular rate: 77 {beats}/min

## 2022-08-18 ENCOUNTER — Encounter (INDEPENDENT_AMBULATORY_CARE_PROVIDER_SITE_OTHER): Payer: Self-pay | Admitting: NURSE PRACTITIONER

## 2022-09-03 ENCOUNTER — Encounter (INDEPENDENT_AMBULATORY_CARE_PROVIDER_SITE_OTHER): Payer: Self-pay | Admitting: NURSE PRACTITIONER

## 2022-09-03 ENCOUNTER — Other Ambulatory Visit: Payer: Self-pay

## 2022-09-03 ENCOUNTER — Ambulatory Visit (INDEPENDENT_AMBULATORY_CARE_PROVIDER_SITE_OTHER): Payer: Medicare Other | Admitting: NURSE PRACTITIONER

## 2022-09-03 VITALS — BP 156/80 | HR 87 | Ht 62.0 in | Wt 133.0 lb

## 2022-09-03 DIAGNOSIS — I10 Essential (primary) hypertension: Secondary | ICD-10-CM

## 2022-09-03 DIAGNOSIS — R0600 Dyspnea, unspecified: Secondary | ICD-10-CM

## 2022-09-03 DIAGNOSIS — I428 Other cardiomyopathies: Secondary | ICD-10-CM

## 2022-09-03 DIAGNOSIS — I447 Left bundle-branch block, unspecified: Secondary | ICD-10-CM

## 2022-09-03 DIAGNOSIS — R9431 Abnormal electrocardiogram [ECG] [EKG]: Secondary | ICD-10-CM

## 2022-09-03 DIAGNOSIS — I5042 Chronic combined systolic (congestive) and diastolic (congestive) heart failure: Secondary | ICD-10-CM

## 2022-09-03 MED ORDER — SACUBITRIL 24 MG-VALSARTAN 26 MG TABLET
1.0000 | ORAL_TABLET | Freq: Two times a day (BID) | ORAL | 3 refills | Status: DC
Start: 2022-09-03 — End: 2022-09-04

## 2022-09-03 MED ORDER — SPIRONOLACTONE 25 MG TABLET
25.0000 mg | ORAL_TABLET | Freq: Every morning | ORAL | 3 refills | Status: DC
Start: 2022-09-03 — End: 2022-12-12

## 2022-09-03 NOTE — Progress Notes (Signed)
Cardiology [facility]    Name: Deanna Flowers  Age: 73 y.o.  Date of Service: 09/03/2022    Primary Care Provider: Erenest Rasher, DO  Chief Complaint:   Chief Complaint   Patient presents with    Hospital Follow Up    Hypertension       Subjective:  The patient has a history of hypertension, hyperlipidemia, left bundle-branch block, and anxiety. She was also diagnosed with dementia approximately 4 years ago.    04/09/2022: The patient presents to establish care for c/o chest pain and 03/2022 [facility] abnormal moderate risk stress test. This study shows global hypokinesia especially of the anteroapical region with a EF 48%.  She reports the chest pain symptoms come on suddenly, she feels hot and dizzy and then will have cold chills . The symptoms have occurred proximally 5 times over the past year.  She is unable to describe in more detail. She is a/o x 3 however is forgetful. She does tell me that stress may be part of the problem.    09/03/22 The patient is here for follow-up from heart catheterization performed on 07/16/2022.  This showed angiographically normal coronaries.  EF 30%, nonischemic cardiomyopathy.  She has had some issues with shortness of breath with exertion.  She also feels like her heart rate will elevate when this occurs.  She gets some chest discomfort for the same time as well.  She is tolerating medications well that were started after her heart catheterization.  She has not been checking blood pressure at home.    Past Medical History:  Past Medical History:   Diagnosis Date    Abnormal cardiovascular stress test 03/28/2022    [place] [facility]    Esophageal reflux     Essential hypertension     Herpes simplex     Memory impairment     Mixed hyperlipidemia     Osteoporosis     Restless leg     Vitamin D deficiency          Social History:  Social History     Tobacco Use   Smoking Status Never   Smokeless Tobacco Never      Social History     Substance and Sexual Activity   Alcohol  Use Never      Social History     Substance and Sexual Activity   Drug Use Never      Current Medications:  Current Outpatient Medications   Medication Sig    buPROPion (WELLBUTRIN XL) 300 mg extended release 24 hr tablet 1 Tablet (300 mg total) Once a day    carvediloL (COREG) 3.125 mg Oral Tablet Take 1 Tablet (3.125 mg total) by mouth Twice daily with food Indications: 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 Release 24 hr 1 Capsule (4 mg total) Every night    donepeziL (ARICEPT) 10 mg Oral Tablet 1 Tablet (10 mg total) Every night    escitalopram oxalate (LEXAPRO) 20 mg Oral Tablet Take 1 Tablet (20 mg total) by mouth Once a day    memantine (NAMENDA) 10 mg Oral Tablet 1 Tablet (10 mg total) Twice daily    pantoprazole (PROTONIX) 40 mg Oral Tablet, Delayed Release (E.C.) 1 Tablet (40 mg total) Once a day  raloxifene (EVISTA) 60 mg Oral Tablet 1 Tablet (60 mg total) Once a day    rOPINIRole (REQUIP) 1 mg Oral Tablet 1 Tablet (1 mg total) Twice daily    sacubitriL-valsartan (ENTRESTO) 24-26 mg Oral Tablet Take 1 Tablet by mouth Twice daily    simvastatin (ZOCOR) 20 mg Oral Tablet Take 1 Tablet (20 mg total) by mouth Every evening    spironolactone (ALDACTONE) 25 mg Oral Tablet Take 1 Tablet (25 mg total) by mouth Every morning with breakfast    valACYclovir (VALTREX) 500 mg Oral Tablet 1 Tablet (500 mg total) Once a day    venlafaxine (EFFEXOR XR) 75 mg Oral Capsule, Sust. Release 24 hr 1 Capsule (75 mg total) Twice daily Pt takes 2 every morning and 1 at night,     Allergies:  Allergies   Allergen Reactions    Penicillins     Sulfa (Sulfonamides)       Review of Systems:  Complete ROS was performed and otherwise negative unless noted in HPI.    Vital Signs:  Vitals:    09/03/22 1508   BP: (!) 156/80   Pulse: 87   SpO2: 97%    Weight: 60.3 kg (133 lb)   Height: 1.575 m (5\' 2" )   BMI: 24.38      Physical Exam:  General: Pt resting comfortably in no acute distress and appears stated age.    Neck: No JVD, no carotid bruit. Neck supple, symmetrical, trachea midline.   Lungs:  Normal respiratory effort, lungs clear to auscultation bilaterally.    Cardiovascular:  Regular rate and rhythm.  Normal S1 and S2 without murmur, gallop, or rub.  Abdomen: Soft, non-tender and bowel sounds normal.    Extremities: Extremities normal, atraumatic, no cyanosis or edema.    Neurologic: Alert and oriented x3.     Assessment:    Nonischemic cardiomyopathy (CMS HCC)    Left bundle branch block    Chronic combined systolic and diastolic CHF, NYHA class 2 (CMS HCC)    Essential hypertension    Dyspnea, unspecified type      Plan:   We will adjust patient's medications further, change valsartan to Entresto.  Also add Aldactone.  Return office in 4-6 weeks to reassess.  Monitor blood pressure at home and bring in log at next visit.  We will consider SGLT2 at next visit if tolerating medications well.  Also consider repeat echo at next visit.  Discussed with patient and son at length about potential Bi V pacing versus Bi V ICD if EF does not improve.  I would like to have her on Entresto a while before repeating echo.    Orders placed this visit:  Orders Placed This Encounter    EKG (In-Clinic Today)    sacubitriL-valsartan (ENTRESTO) 24-26 mg Oral Tablet    spironolactone (ALDACTONE) 25 mg Oral Tablet       Deanna Flowers is to return to clinic for follow up with the understanding that should symptoms change or worsen she is to call the office or go to the closest emergency department for evaluation.    Isabell Jarvis, APRN,FNP-BC    A portion of this documentation may have been generated using MMODAL voice recognition software and may contain syntax/voice recognition errors.

## 2022-09-04 ENCOUNTER — Other Ambulatory Visit (INDEPENDENT_AMBULATORY_CARE_PROVIDER_SITE_OTHER): Payer: Self-pay | Admitting: NURSE PRACTITIONER

## 2022-09-04 MED ORDER — SACUBITRIL 24 MG-VALSARTAN 26 MG TABLET
1.0000 | ORAL_TABLET | Freq: Two times a day (BID) | ORAL | 3 refills | Status: DC
Start: 2022-09-04 — End: 2022-12-17

## 2022-10-08 ENCOUNTER — Other Ambulatory Visit: Payer: Self-pay

## 2022-10-08 ENCOUNTER — Encounter (INDEPENDENT_AMBULATORY_CARE_PROVIDER_SITE_OTHER): Payer: Self-pay | Admitting: NURSE PRACTITIONER

## 2022-10-08 ENCOUNTER — Ambulatory Visit (INDEPENDENT_AMBULATORY_CARE_PROVIDER_SITE_OTHER): Payer: Medicare Other | Admitting: NURSE PRACTITIONER

## 2022-10-08 VITALS — BP 126/68 | HR 74 | Ht 62.0 in | Wt 125.0 lb

## 2022-10-08 DIAGNOSIS — I10 Essential (primary) hypertension: Secondary | ICD-10-CM

## 2022-10-08 DIAGNOSIS — R0609 Other forms of dyspnea: Secondary | ICD-10-CM

## 2022-10-08 DIAGNOSIS — I428 Other cardiomyopathies: Secondary | ICD-10-CM

## 2022-10-08 DIAGNOSIS — I447 Left bundle-branch block, unspecified: Secondary | ICD-10-CM

## 2022-10-08 MED ORDER — EMPAGLIFLOZIN 10 MG TABLET
10.0000 mg | ORAL_TABLET | Freq: Every day | ORAL | 2 refills | Status: DC
Start: 2022-10-08 — End: 2023-06-10

## 2022-10-08 NOTE — Progress Notes (Signed)
Cardiology [facility]    Name: Deanna Flowers  Age: 72 y.o.  Date of Service: 10/08/2022    Primary Care Provider: Erenest Rasher, DO  Chief Complaint:   Chief Complaint   Patient presents with    CHF    Hypertension    Follow Up       Subjective:  The patient has a history of hypertension, hyperlipidemia, left bundle-branch block, and anxiety. She was also diagnosed with dementia approximately 4 years ago.    04/09/2022: The patient presents to establish care for c/o chest pain and 03/2022 [facility] abnormal moderate risk stress test. This study shows global hypokinesia especially of the anteroapical region with a EF 48%.  She reports the chest pain symptoms come on suddenly, she feels hot and dizzy and then will have cold chills . The symptoms have occurred proximally 5 times over the past year.  She is unable to describe in more detail. She is a/o x 3 however is forgetful. She does tell me that stress may be part of the problem.    09/03/22 The patient is here for follow-up from heart catheterization performed on 07/16/2022.  This showed angiographically normal coronaries.  EF 30%, nonischemic cardiomyopathy.  She has had some issues with shortness of breath with exertion.  She also feels like her heart rate will elevate when this occurs.  She gets some chest discomfort for the same time as well.  She is tolerating medications well that were started after her heart catheterization.  She has not been checking blood pressure at home.    10/08/2022: The patient presents for follow up medication  changes including addition of entresto and aldactone. She is tolerating well. She tells me she feels it is helping. Her insurance is covering. She denies chest pain. She does have occasional palpitation, and does feel short of breath with exertion which is not new.     Past Medical History:  Past Medical History:   Diagnosis Date    Abnormal cardiovascular stress test 03/28/2022    [place] [facility]    Esophageal reflux      Essential hypertension     Herpes simplex     Memory impairment     Mixed hyperlipidemia     Osteoporosis     Restless leg     Vitamin D deficiency          Social History:  Social History     Tobacco Use   Smoking Status Never   Smokeless Tobacco Never      Social History     Substance and Sexual Activity   Alcohol Use Never      Social History     Substance and Sexual Activity   Drug Use Never      Current Medications:  Current Outpatient Medications   Medication Sig    buPROPion (WELLBUTRIN XL) 300 mg extended release 24 hr tablet 1 Tablet (300 mg total) Once a day    carvediloL (COREG) 3.125 mg Oral Tablet Take 1 Tablet (3.125 mg total) by mouth Twice daily with food Indications: 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 Release 24 hr 1 Capsule (4 mg total) Every night    donepeziL (ARICEPT) 10 mg Oral Tablet Take 1 Tablet (10 mg total)  by mouth Every night 1 tablet in am and 0.5mg  tab in pm    escitalopram oxalate (LEXAPRO) 20 mg Oral Tablet Take 1 Tablet (20 mg total) by mouth Once a day    memantine (NAMENDA) 10 mg Oral Tablet 1 Tablet (10 mg total) Twice daily    pantoprazole (PROTONIX) 40 mg Oral Tablet, Delayed Release (E.C.) 1 Tablet (40 mg total) Once a day    raloxifene (EVISTA) 60 mg Oral Tablet 1 Tablet (60 mg total) Once a day    rOPINIRole (REQUIP) 1 mg Oral Tablet 1 Tablet (1 mg total) Twice daily    sacubitriL-valsartan (ENTRESTO) 24-26 mg Oral Tablet Take 1 Tablet by mouth Twice daily    simvastatin (ZOCOR) 20 mg Oral Tablet Take 1 Tablet (20 mg total) by mouth Every evening    spironolactone (ALDACTONE) 25 mg Oral Tablet Take 1 Tablet (25 mg total) by mouth Every morning with breakfast    valACYclovir (VALTREX) 500 mg Oral Tablet 1 Tablet (500 mg total) Once a day    venlafaxine (EFFEXOR XR) 75 mg Oral Capsule,  Sust. Release 24 hr 1 Capsule (75 mg total) Twice daily Pt takes 2 every morning and 1 at night,     Allergies:  Allergies   Allergen Reactions    Penicillins     Sulfa (Sulfonamides)       Review of Systems:  Complete ROS was performed and otherwise negative unless noted in HPI.    Vital Signs:  Vitals:    10/08/22 1444   BP: 126/68   Pulse: 74   SpO2: 97%   Weight: 56.7 kg (125 lb)   Height: 1.575 m (5\' 2" )   BMI: 22.91        Physical Exam:  General: Pt resting comfortably in no acute distress and appears stated age.    Neck: No JVD, no carotid bruit. Neck supple, symmetrical, trachea midline.   Lungs:  Normal respiratory effort, lungs clear to auscultation bilaterally.    Cardiovascular:  Regular rate and rhythm.  Normal S1 and S2 without murmur, gallop, or rub.  Abdomen: Soft, non-tender and bowel sounds normal.    Extremities: Extremities normal, atraumatic, no cyanosis or edema.    Neurologic: Alert and oriented x3.     Assessment:    Nonischemic cardiomyopathy (CMS HCC)    Left bundle branch block    Essential hypertension    DOE (dyspnea on exertion)      Plan:   We will adjust patient's medications further, add Jardiance 10 mg. Continue all other medications as prescribed. repeat echo in 6 -8 week, and follow up after.  Discussed with patient and daughter.  Consider potential Bi V pacing versus Bi V ICD if EF does not improve.     Orders Placed This Encounter    EKG (In-Clinic Today)       LULLA LINVILLE is to return to clinic for follow up with the understanding that should symptoms change or worsen she is to call the office or go to the closest emergency department for evaluation.    Isabell Jarvis, APRN,FNP-BC    A portion of this documentation may have been generated using MMODAL voice recognition software and may contain syntax/voice recognition errors.

## 2022-10-10 ENCOUNTER — Telehealth (INDEPENDENT_AMBULATORY_CARE_PROVIDER_SITE_OTHER): Payer: Self-pay | Admitting: INTERVENTIONAL CARDIOLOGY

## 2022-10-10 NOTE — Telephone Encounter (Signed)
Cammy Brochure, MPOE, was notified of scheduled ECHO for Deanna Flowers on 11/04/22 at 10am at [facility].  Mr. [name] states understanding.

## 2022-10-14 IMAGING — MG 3D SCREENING MAMMO BIL AND TOMO
5 series · 8 of 24 positions shown · non-contrast
Comparison: Last available mammogram dated 11/14/2016.

------------- REPORT [id number] -------------
[facility]
[address]
We wish to report the following on your recent mammography examination. We are sending a report to your referring physician or other health care provider.
INDICATION: Screening mammogram.  Asymptomatic 71-year-old with no family history.  Lifetime breast cancer risk 5.4%.

[L]
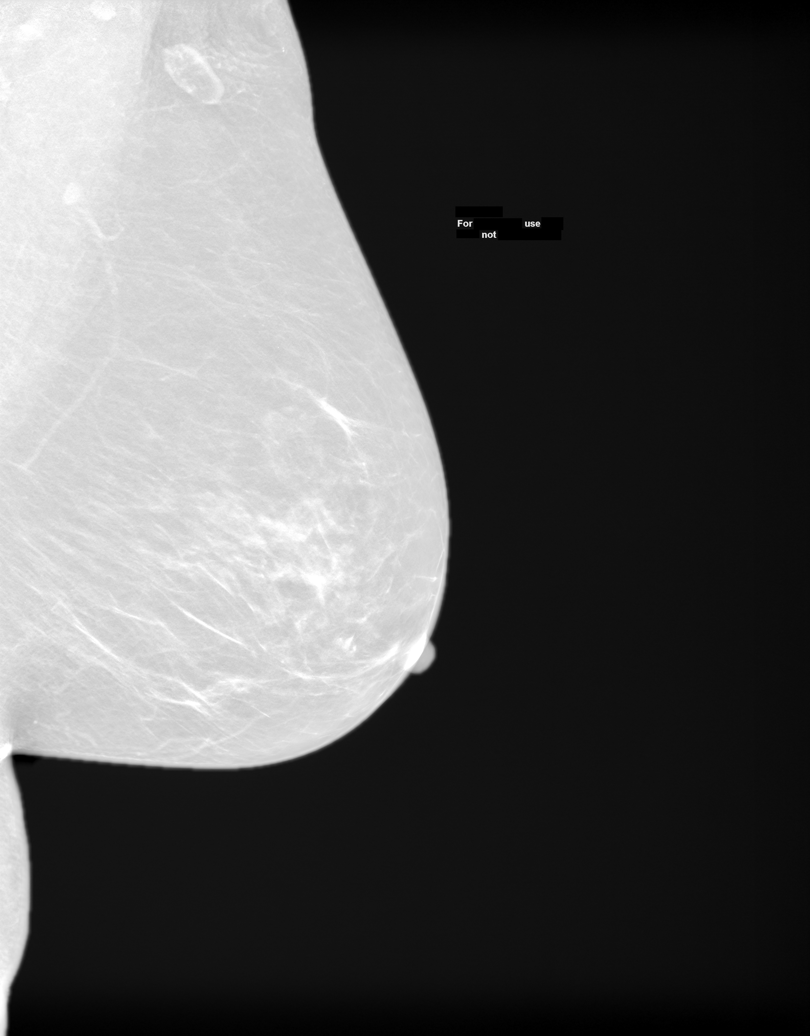

[R]
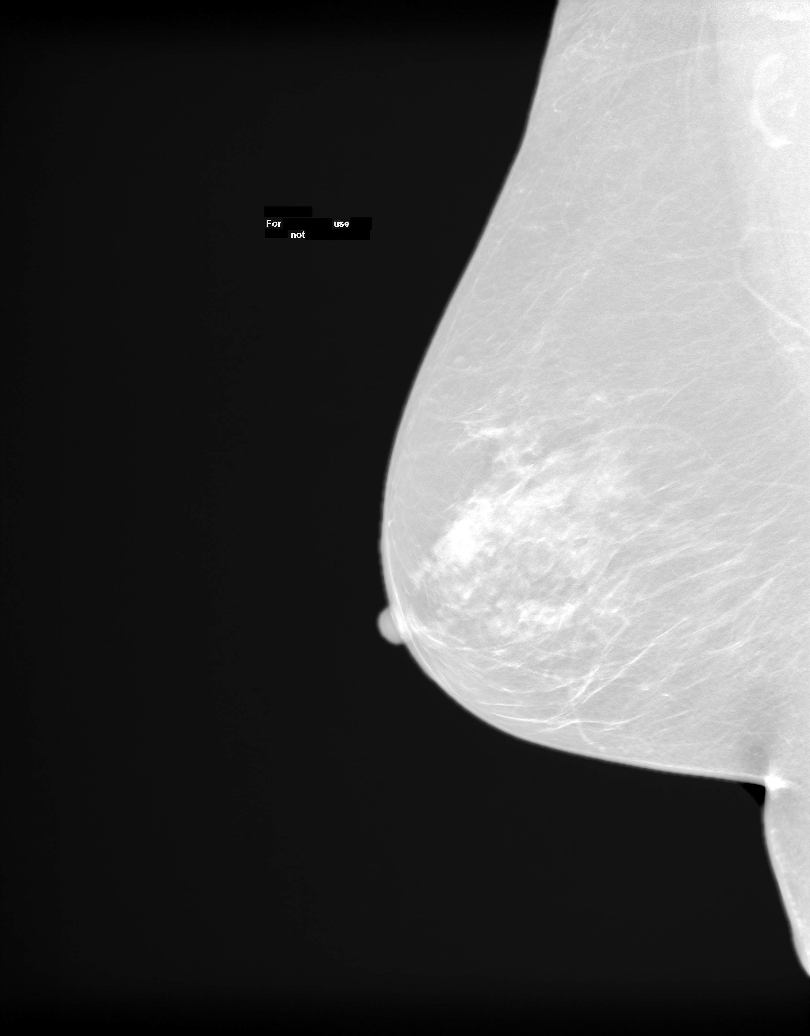

[R CC tomo · right · 0.10mm/px · 2 of 2 slices shown]
[im 1/2]
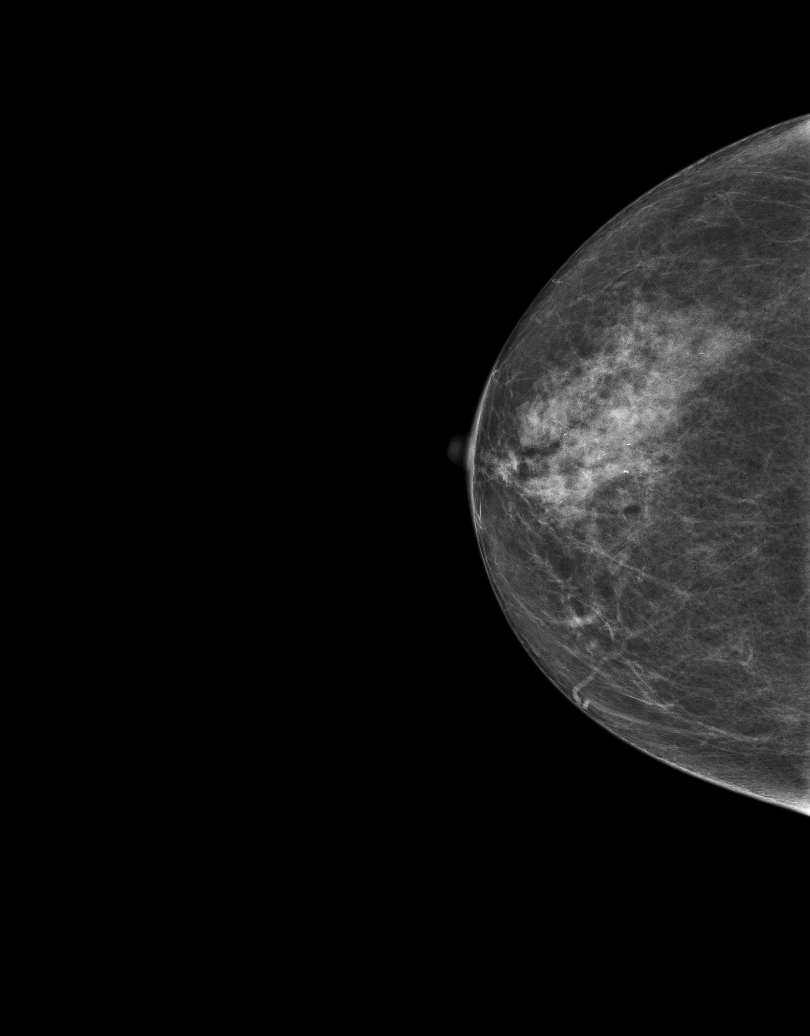
[im 2/2]
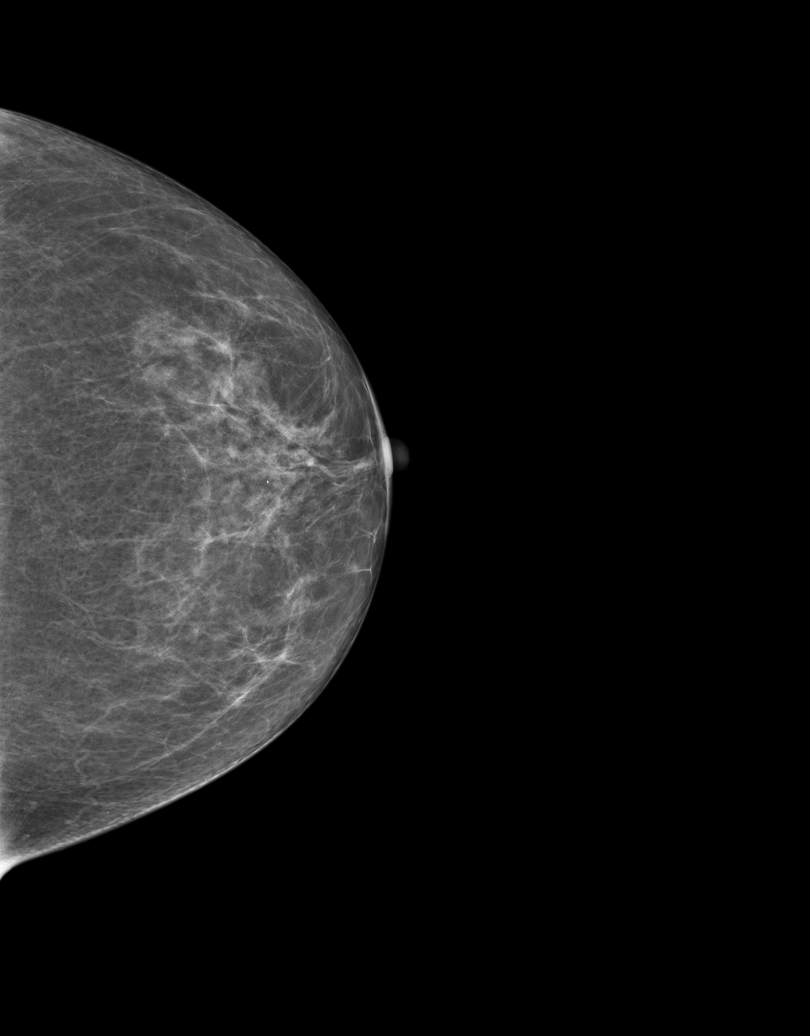

[3D SCREENING MAMMO BIL AND TOMO tomo · 2 acquisitions, 3 frames shown (1 of 2)]
[im 1/2]
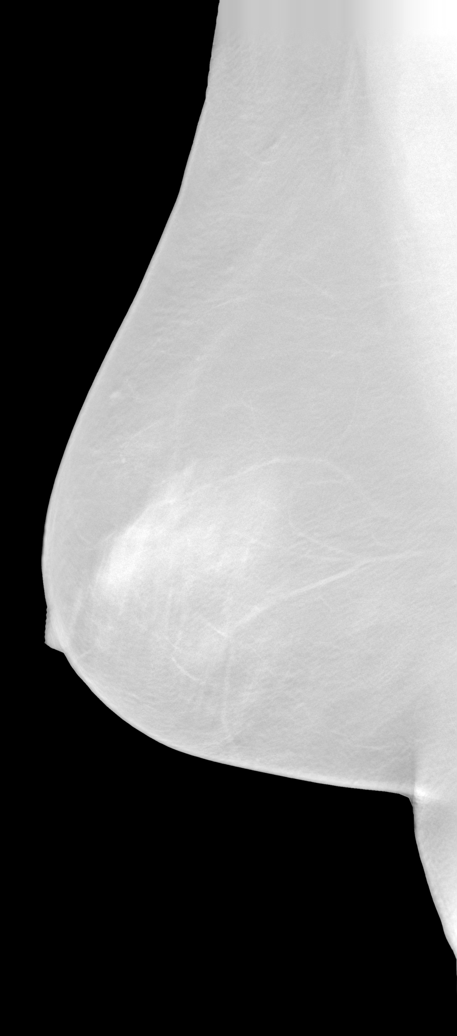
[im 2/2]
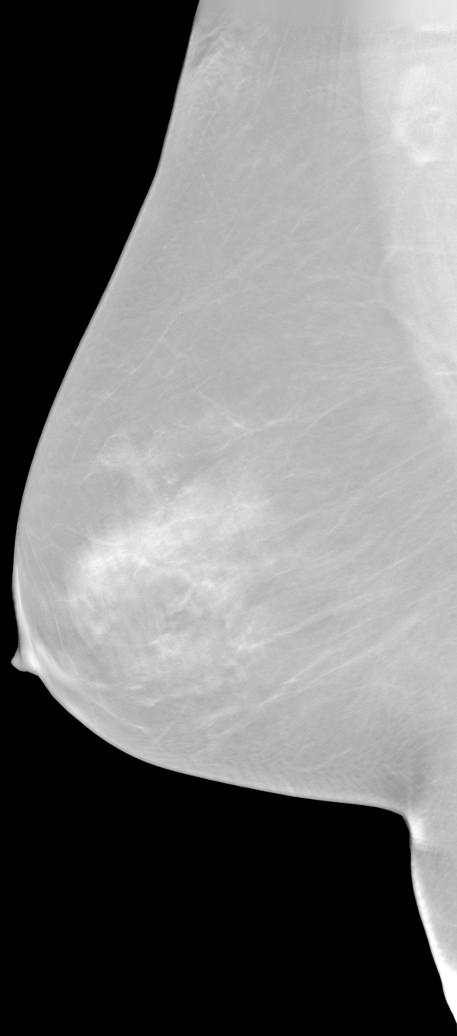
[im 2/2]
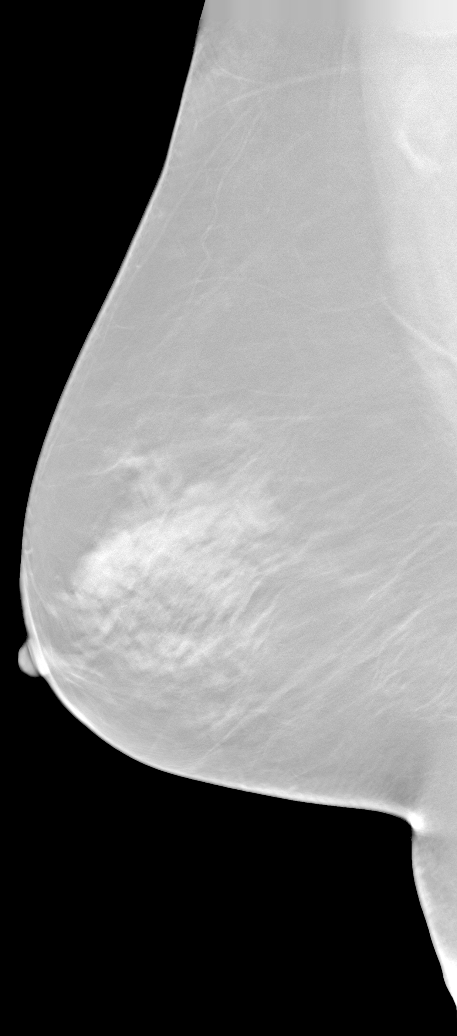

[3D SCREENING MAMMO BIL AND TOMO tomo (2 of 2) · tomo slice 11/66.0]
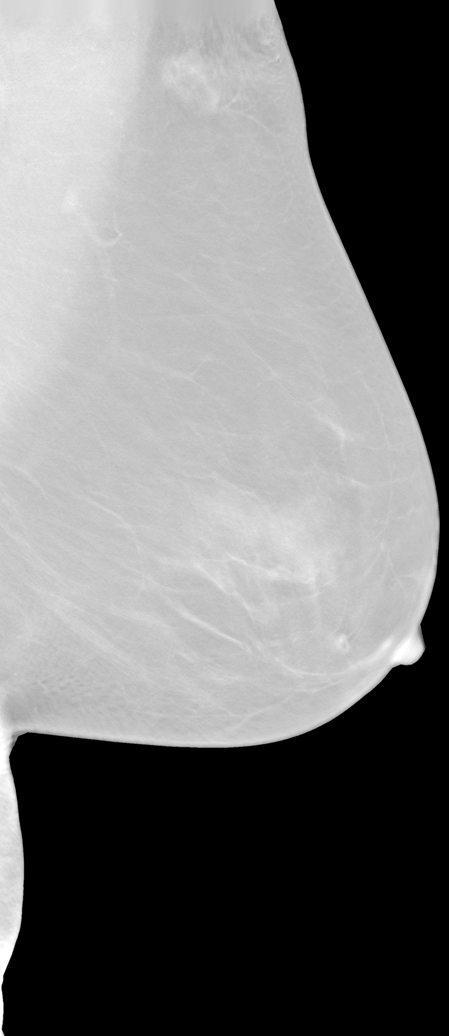

[8 of 24 positions shown; findings below may reference images not displayed]

FINDING: Normal-no evidence of cancer

This statement is mandated by the [place], Department of Health.
Your examination was performed by one of our technologists, who are registered radiological technologists and also specially certified in mammography:
___
[place], [place] (M)

Your mammogram was interpreted by our radiologist.

( 
Amore Pho, M.D.

(Annual Breast Examination by a physician or other health care provider
(Annual Mammography Screening beginning at age 40
(Monthly Breast Self Examination

------------- REPORT [id number] -------------
﻿

EXAM:  3D SCREENING MAMMO BIL AND TOMO
FINDINGS: No new mass or architectural changes are noted.  No abnormal calcific densities, skin changes, nipple changes or duct dilation are seen.
IMPRESSION: 1.  BIRADS 2-Benign findings. Patient has been added in a reminder system with a target date for the next screening mammography.

2.  DENSITY CODE – B (Scattered areas of fibroglandular density)  

Final Assessment Code:

BI-RADS 0
 Need additional imaging evaluation.

BI-RADS 1
 Negative mammogram.

BI-RADS 2
 Benign finding.

BI-RADS 3
 Probably benign finding; short-interval follow-up suggested.

BI-RADS 4
 Suspicious abnormality; biopsy should be considered.

BI-RADS 5
 Highly suggestive of malignancy; appropriate action should be taken.

BI-RADS 6
 Known biopsy-proven malignancy; appropriate action should be taken.

NOTE:
In compliance with Federal regulations, the results of this mammogram are being sent to the patient.

## 2022-10-21 LAB — ECG W INTERP (AMB USE ONLY)(MUSE,IN CLINIC)
Atrial Rate: 85 {beats}/min
Calculated P Axis: 66 degrees
Calculated R Axis: -68 degrees
Calculated T Axis: 96 degrees
PR Interval: 128 ms
QRS Duration: 150 ms
QT Interval: 444 ms
QTC Calculation: 528 ms
Ventricular rate: 85 {beats}/min

## 2022-11-04 ENCOUNTER — Ambulatory Visit (HOSPITAL_COMMUNITY): Payer: Medicare Other

## 2022-11-17 LAB — ECG W INTERP (AMB USE ONLY)(MUSE,IN CLINIC)
Atrial Rate: 77 {beats}/min
Calculated P Axis: 53 degrees
Calculated R Axis: -38 degrees
Calculated T Axis: 106 degrees
PR Interval: 120 ms
QRS Duration: 152 ms
QT Interval: 476 ms
QTC Calculation: 538 ms
Ventricular rate: 77 {beats}/min

## 2022-11-26 ENCOUNTER — Other Ambulatory Visit: Payer: Self-pay

## 2022-11-26 ENCOUNTER — Inpatient Hospital Stay
Admission: RE | Admit: 2022-11-26 | Discharge: 2022-11-26 | Disposition: A | Discharge status: Home / Self Care | Payer: Medicare Other | Source: Ambulatory Visit | Attending: NURSE PRACTITIONER | Admitting: NURSE PRACTITIONER

## 2022-11-26 DIAGNOSIS — I447 Left bundle-branch block, unspecified: Secondary | ICD-10-CM | POA: Insufficient documentation

## 2022-11-26 DIAGNOSIS — I428 Other cardiomyopathies: Secondary | ICD-10-CM

## 2022-11-26 MED ORDER — SODIUM CHLORIDE 0.9 % INJECTION SOLUTION
2.0000 mL | Freq: Once | INTRAVENOUS | Status: DC | PRN
Start: 2022-11-26 — End: 2022-11-27
  Administered 2022-11-26: 4 mL via INTRAVENOUS

## 2022-11-27 DIAGNOSIS — I351 Nonrheumatic aortic (valve) insufficiency: Secondary | ICD-10-CM

## 2022-11-27 DIAGNOSIS — I501 Left ventricular failure: Secondary | ICD-10-CM

## 2022-12-05 ENCOUNTER — Other Ambulatory Visit (INDEPENDENT_AMBULATORY_CARE_PROVIDER_SITE_OTHER): Payer: Self-pay | Admitting: INTERVENTIONAL CARDIOLOGY

## 2022-12-05 MED ORDER — CARVEDILOL 3.125 MG TABLET
3.1250 mg | ORAL_TABLET | Freq: Two times a day (BID) | ORAL | 3 refills | Status: DC
Start: 2022-12-05 — End: 2023-06-10

## 2022-12-10 ENCOUNTER — Other Ambulatory Visit: Payer: Self-pay

## 2022-12-10 ENCOUNTER — Encounter (INDEPENDENT_AMBULATORY_CARE_PROVIDER_SITE_OTHER): Payer: Self-pay | Admitting: NURSE PRACTITIONER

## 2022-12-10 ENCOUNTER — Ambulatory Visit (INDEPENDENT_AMBULATORY_CARE_PROVIDER_SITE_OTHER): Payer: Medicare Other | Admitting: NURSE PRACTITIONER

## 2022-12-10 VITALS — BP 129/47 | HR 65 | Ht 62.0 in | Wt 129.4 lb

## 2022-12-10 DIAGNOSIS — I428 Other cardiomyopathies: Secondary | ICD-10-CM

## 2022-12-10 DIAGNOSIS — I5042 Chronic combined systolic (congestive) and diastolic (congestive) heart failure: Secondary | ICD-10-CM

## 2022-12-10 DIAGNOSIS — I447 Left bundle-branch block, unspecified: Secondary | ICD-10-CM

## 2022-12-10 DIAGNOSIS — R9431 Abnormal electrocardiogram [ECG] [EKG]: Secondary | ICD-10-CM

## 2022-12-10 DIAGNOSIS — I10 Essential (primary) hypertension: Secondary | ICD-10-CM

## 2022-12-10 NOTE — Progress Notes (Signed)
Cardiology Clinic [facility]    Name: Deanna Flowers  Age: 72 y.o.  Date of Service: 12/10/2022    Primary Care Provider: Emilia Beck, DO  Chief Complaint:   Chief Complaint   Patient presents with    Follow Up     Non-ischemic cardiomyopathy  Echocardiogram 04.2024  LBBB    Hypertension       Subjective:  The patient has a history of hypertension, hyperlipidemia, left bundle-branch block, and anxiety. She was also diagnosed with dementia approximately 4 years ago.    04/09/2022: The patient presents to establish care for c/o chest pain and 03/2022 [facility] abnormal moderate risk stress test. This study shows global hypokinesia especially of the anteroapical region with a EF 48%.  She reports the chest pain symptoms come on suddenly, she feels hot and dizzy and then will have cold chills . The symptoms have occurred proximally 5 times over the past year.  She is unable to describe in more detail. She is a/o x 3 however is forgetful. She does tell me that stress may be part of the problem.    09/03/22 The patient is here for follow-up from heart catheterization performed on 07/16/2022.  This showed angiographically normal coronaries.  EF 30%, nonischemic cardiomyopathy.  She has had some issues with shortness of breath with exertion.  She also feels like her heart rate will elevate when this occurs.  She gets some chest discomfort for the same time as well.  She is tolerating medications well that were started after her heart catheterization.  She has not been checking blood pressure at home.    10/08/2022: The patient presents for follow up medication  changes including addition of entresto and aldactone. She is tolerating well. She tells me she feels it is helping. Her insurance is covering. She denies chest pain. She does have occasional palpitation, and does feel short of breath with exertion which is not new.     12/10/2022: The patient presents for follow up echocardiogram. EF has normalized. She denies  chest pain, dyspnea or edema. She is feeling well and reports she is feeling better now. She is tolerating medications and insurance is covering her prescriptions.     Past Medical History:  Past Medical History:   Diagnosis Date    Abnormal cardiovascular stress test 03/28/2022    [place] - [facility]    Esophageal reflux     Essential hypertension     Herpes simplex     Memory impairment     Mixed hyperlipidemia     Osteoporosis     Restless leg     Vitamin D deficiency          Social History:  Social History     Tobacco Use   Smoking Status Never   Smokeless Tobacco Never      Social History     Substance and Sexual Activity   Alcohol Use Never      Social History     Substance and Sexual Activity   Drug Use Never      Current Medications:  Current Outpatient Medications   Medication Sig    buPROPion (WELLBUTRIN XL) 300 mg extended release 24 hr tablet 1 Tablet (300 mg total) Once a day    carvediloL (COREG) 3.125 mg Oral Tablet Take 1 Tablet (3.125 mg total) by mouth Twice daily with food Indications: 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 Release 24 hr 1 Capsule (4 mg total) Every night    donepeziL (ARICEPT) 10 mg Oral Tablet Take 1 Tablet (10 mg total) by mouth Every night 1 tablet in am and 0.5mg  tab in pm    empagliflozin (JARDIANCE) 10 mg Oral Tablet Take 1 Tablet (10 mg total) by mouth Once a day for 270 days    escitalopram oxalate (LEXAPRO) 20 mg Oral Tablet Take 1 Tablet (20 mg total) by mouth Once a day    memantine (NAMENDA) 10 mg Oral Tablet 1 Tablet (10 mg total) Twice daily    pantoprazole (PROTONIX) 40 mg Oral Tablet, Delayed Release (E.C.) 1 Tablet (40 mg total) Once a day    raloxifene (EVISTA) 60 mg Oral Tablet 1 Tablet (60 mg total) Once a day    rOPINIRole (REQUIP) 1 mg Oral Tablet 1 Tablet (1 mg total) Twice daily     sacubitriL-valsartan (ENTRESTO) 24-26 mg Oral Tablet Take 1 Tablet by mouth Twice daily    simvastatin (ZOCOR) 20 mg Oral Tablet Take 1 Tablet (20 mg total) by mouth Every evening    spironolactone (ALDACTONE) 25 mg Oral Tablet Take 1 Tablet (25 mg total) by mouth Every morning with breakfast    valACYclovir (VALTREX) 500 mg Oral Tablet 1 Tablet (500 mg total) Once a day    venlafaxine (EFFEXOR XR) 75 mg Oral Capsule, Sust. Release 24 hr 1 Capsule (75 mg total) Twice daily Pt takes 2 every morning and 1 at night,     Allergies:  Allergies   Allergen Reactions    Penicillins     Sulfa (Sulfonamides)       Review of Systems:  Complete ROS was performed and otherwise negative unless noted in HPI.    Vital Signs:  Vitals:    12/10/22 0906   BP: (!) 129/47   Pulse: 65   SpO2: 97%   Weight: 58.7 kg (129 lb 6 oz)   Height: 1.575 m (5\' 2" )   BMI: 23.71        Physical Exam:  General: Pt resting comfortably in no acute distress and appears stated age.    Neck: No JVD, no carotid bruit. Neck supple, symmetrical, trachea midline.   Lungs:  Normal respiratory effort, lungs clear to auscultation bilaterally.    Cardiovascular:  Regular rate and rhythm.  Normal S1 and S2 without murmur, gallop, or rub.  Abdomen: Soft, non-tender and bowel sounds normal.    Extremities: Extremities normal, atraumatic, no cyanosis or edema.    Neurologic: Alert and oriented x3.     Assessment:    Nonischemic cardiomyopathy (CMS HCC)    Left bundle branch block    Essential hypertension    Chronic combined systolic and diastolic CHF, NYHA class 2 (CMS HCC)      Plan:   EF has normalized. She is feeling well. Will  Continue medications as prescribed. Discussed with patient and daughter. RTC in 4-6 months to reassess.      Orders Placed This Encounter    EKG (In-Clinic Today)       HAYSLEE GENSCH is to return to clinic for follow up with the understanding that should symptoms change or worsen she is to call the office or go to the closest  emergency department for evaluation.    Emilee J Huffman, APRN,FNP-BC    A portion  of this documentation may have been generated using MMODAL voice recognition software and may contain syntax/voice recognition errors.

## 2022-12-12 ENCOUNTER — Other Ambulatory Visit (INDEPENDENT_AMBULATORY_CARE_PROVIDER_SITE_OTHER): Payer: Self-pay | Admitting: NURSE PRACTITIONER

## 2022-12-12 MED ORDER — SPIRONOLACTONE 25 MG TABLET
25.0000 mg | ORAL_TABLET | Freq: Every morning | ORAL | 1 refills | Status: DC
Start: 2022-12-12 — End: 2023-01-12

## 2022-12-17 ENCOUNTER — Other Ambulatory Visit (INDEPENDENT_AMBULATORY_CARE_PROVIDER_SITE_OTHER): Payer: Self-pay | Admitting: NURSE PRACTITIONER

## 2022-12-17 MED ORDER — SACUBITRIL 24 MG-VALSARTAN 26 MG TABLET
1.0000 | ORAL_TABLET | Freq: Two times a day (BID) | ORAL | 3 refills | Status: DC
Start: 2022-12-17 — End: 2022-12-17

## 2022-12-17 MED ORDER — SACUBITRIL 24 MG-VALSARTAN 26 MG TABLET
1.0000 | ORAL_TABLET | Freq: Two times a day (BID) | ORAL | 1 refills | Status: DC
Start: 2022-12-17 — End: 2023-01-20

## 2023-01-09 ENCOUNTER — Other Ambulatory Visit (INDEPENDENT_AMBULATORY_CARE_PROVIDER_SITE_OTHER): Payer: Self-pay | Admitting: NURSE PRACTITIONER

## 2023-01-13 ENCOUNTER — Other Ambulatory Visit (INDEPENDENT_AMBULATORY_CARE_PROVIDER_SITE_OTHER): Payer: Self-pay | Admitting: NURSE PRACTITIONER

## 2023-01-17 ENCOUNTER — Other Ambulatory Visit (INDEPENDENT_AMBULATORY_CARE_PROVIDER_SITE_OTHER): Payer: Self-pay | Admitting: NURSE PRACTITIONER

## 2023-01-30 LAB — ECG W INTERP (AMB USE ONLY)(MUSE,IN CLINIC)
Atrial Rate: 69 {beats}/min
Calculated P Axis: 62 degrees
Calculated R Axis: -71 degrees
Calculated T Axis: 122 degrees
PR Interval: 128 ms
QRS Duration: 140 ms
QT Interval: 474 ms
QTC Calculation: 507 ms
Ventricular rate: 69 {beats}/min

## 2023-02-14 ENCOUNTER — Emergency Department
Admission: EM | Admit: 2023-02-14 | Discharge: 2023-02-14 | Disposition: A | Discharge status: Home / Self Care | Payer: Medicare Other | Attending: Emergency Medicine | Admitting: Emergency Medicine

## 2023-02-14 ENCOUNTER — Emergency Department (HOSPITAL_COMMUNITY): Payer: Medicare Other

## 2023-02-14 ENCOUNTER — Other Ambulatory Visit: Payer: Self-pay

## 2023-02-14 DIAGNOSIS — S93119A Dislocation of interphalangeal joint of unspecified toe(s), initial encounter: Secondary | ICD-10-CM

## 2023-02-14 DIAGNOSIS — W1809XA Striking against other object with subsequent fall, initial encounter: Secondary | ICD-10-CM | POA: Insufficient documentation

## 2023-02-14 DIAGNOSIS — W228XXA Striking against or struck by other objects, initial encounter: Secondary | ICD-10-CM

## 2023-02-14 DIAGNOSIS — S93112A Dislocation of interphalangeal joint of left great toe, initial encounter: Secondary | ICD-10-CM | POA: Insufficient documentation

## 2023-02-14 MED ORDER — KETOROLAC 30 MG/ML (1 ML) INJECTION SOLUTION
INTRAMUSCULAR | Status: AC
Start: 2023-02-14 — End: 2023-02-14
  Filled 2023-02-14: qty 1

## 2023-02-14 MED ORDER — KETOROLAC 30 MG/ML (1 ML) INJECTION SOLUTION
30.0000 mg | INTRAMUSCULAR | Status: AC
Start: 2023-02-14 — End: 2023-02-14
  Administered 2023-02-14: 30 mg via INTRAMUSCULAR

## 2023-02-14 NOTE — ED Triage Notes (Signed)
Left great toe deformity and pain. Tripped over a rug this morning.    PRS: 20GLAC, monitor

## 2023-02-14 NOTE — ED Nurses Note (Addendum)
Patient's left great toe buddy tapped per order from provider. Cap refill less than 2sec and pulses strong and equal bilaterally. Patient denies any discomfort at this time. Call bell within reach, friend remains at bedside.

## 2023-02-14 NOTE — ED Provider Notes (Signed)
[facility]  Emergency Department  Attending Provider Note      CHIEF COMPLAINT  Chief Complaint   Patient presents with    Toe Pain     HISTORY OF PRESENT ILLNESS  Deanna Flowers, date of birth 1950-10-04, is a 72 y.o. female who presented to the Emergency Department.    PATIENT PRESENTS EMERGENCY DEPARTMENT TODAY AS RESULT OF INJURING HER LEFT GREAT TOE.  THIS OCCURRED WHILE HITTING IT ON AN OBJECT AT HOME.  SYMPTOMS WORSE WITH ACTIVITY BETTER WITH REST MILD-TO-MODERATE IN NATURE WITH DEFORMITY NOTED TO THE LEFT GREAT TOE.  NO FEVERS CHILLS CHEST PAIN PALPITATION SHORTNESS OF BREATH COUGH DYSURIA HEMATURIA NAUSEA VOMITING.  NO LOSS OF CONSCIOUSNESS OR HEAD TRAUMA.  COMPREHENSIVE 10+ REVIEW OF SYSTEMS OTHERWISE NEGATIVE.  THERE ARE NO OTHER ACUTE COMPLAINTS REPORTED BY THE PATIENT.    PAST MEDICAL/SURGICAL/FAMILY/SOCIAL HISTORY  Past Medical History:   Diagnosis Date    Abnormal cardiovascular stress test 03/28/2022    [place] - [facility]    Esophageal reflux     Essential hypertension     Herpes simplex     Memory impairment     Mixed hyperlipidemia     Osteoporosis     Restless leg     Vitamin D deficiency        Family Medical History:       Problem Relation (Age of Onset)    Diabetes Brother    Hypertension (High Blood Pressure) Mother    Prostate Cancer Father          Social History     Socioeconomic History    Marital status: Widowed   Tobacco Use    Smoking status: Never    Smokeless tobacco: Never   Vaping Use    Vaping status: Never Used   Substance and Sexual Activity    Alcohol use: Never    Drug use: Never      ALLERGIES  Allergies   Allergen Reactions    Penicillins     Sulfa (Sulfonamides)        PHYSICAL EXAM  VITAL SIGNS:  Filed Vitals:    02/14/23 1045 02/14/23 1100 02/14/23 1115 02/14/23 1121   BP: (!) 139/53 (!) 137/53  134/66   Pulse:  70  71   Resp:  17  16   Temp:    36.6 C (97.8 F)   SpO2: 95% 95% 95% 96%     GENERAL: PATIENT IS ALERT AND ORIENTED TO PERSON, PLACE, AND  TIME.  HEAD: NORMOCEPHALIC AND ATRAUMATIC.  EYES: PUPILS EQUALLY ROUND AND REACT TO LIGHT. EXTRAOCULAR MOVEMENTS INTACT.  EARS: GROSS HEARING INTACT. EXTERNAL EARS WITHIN NORMAL LIMITS.  NOSE: NO SEPTAL DEVIATION. NASAL PASSAGES CLEAR.  THROAT: MOIST ORAL MUCOSA.   NECK: SUPPLE. TRACHEA MIDLINE.  HEART: REGULAR, RATE, AND RHYTHM.  LUNGS: CLEAR TO AUSCULTATION BILATERAL BUT DECREASED.  ABDOMEN: SOFT, NON-TENDER, NON-DISTENDED, AND BOWEL SOUNDS ARE PRESENT.  GENITOURINARY: DEFERRED.  RECTAL: DEFERRED.  EXTREMITIES: NO CYANOSIS, CLUBBING, OR EDEMA.  SKIN: WARM AND DRY.  MUSCULOSKELETAL: DEFERRED.  NEUROLOGIC: CRANIAL NERVES II THROUGH XII ARE GROSSLY INTACT. SENSATION TO LIGHT TOUCH IS INTACT.  PSYCHIATRIC: JUDGMENT AND INSIGHT ARE SEEMINGLY INTACT. MOOD AND AFFECT ARE APPROPRIATE FOR THE SITUATION.    PROCEDURES  PATIENT'S LEFT GREAT TOE MANUALLY REDUCED WITH TRACTION AND QUICK MOTION.  PATIENT TOLERATED WELL.  NO COMPLICATIONS.  NURSING TO BUDDY-TAPE THE TOES.    DIAGNOSTICS  Labs:  Labs listed below were reviewed and interpreted by me.  No results found for any visits on 02/14/23.  Radiology:  Results for orders placed or performed during the hospital encounter of 02/14/23   XR FOOT LEFT     Status: None    Narrative    Deanna Flowers    RADIOLOGIST: Markus Jarvis, MD    XR FOOT LEFT performed on 02/14/2023 9:33 AM    CLINICAL HISTORY: PAIN.  Lt foot/toe pain following fall this am    TECHNIQUE:  3 views of the left foot.    COMPARISON:  None.    FINDINGS:   Medial dislocation at the interphalangeal joint of the great toe.  Osteoarthritis of the first metatarsophalangeal joint.  No other fracture or dislocation.        Impression    First interphalangeal joint dislocation           Radiologist location ID: [id number]     XR TOE, 1ST DIGIT LEFT     Status: None    Narrative    Deanna Flowers    RADIOLOGIST: Arlys John Schambach    XR TOE, 1ST DIGIT LEFT, 2 OR > VIEWS performed on 02/14/2023 11:01  AM  CLINICAL HISTORY: POST REDUCTION.  post reduction    TECHNIQUE: 2 views of the left great toe    COMPARISON: Same day    FINDINGS:   There has been interval reduction of the great toe interphalangeal joint. There are small osseous densities medial to the distal proximal phalanx concerning for small fractures.    Soft tissue swelling.        Impression    Interval reduction at the first interphalangeal joint with small fractures.             Radiologist location ID: [id number]         ED COURSE/MEDICAL DECISION MAKING  Medications Administered in the ED   ketorolac (TORADOL) 30 mg/mL injection (30 mg IntraMUSCULAR Given 02/14/23 1052)      ED Course as of 02/14/23 1121   Sat Feb 14, 2023   1121 XR FOOT LEFT  TODAY I, Deanna Flowers, D.O., PERSONALLY VISUALIZED THE PATIENT'S DIAGNOSTIC IMAGING STUDIES.  DEFER TO THE RADIOLOGIST'S REPORT FOR THE FINAL DEFINITIVE RADIOLOGY READING.  MY INITIAL INDEPENDENT INTERPRETATION IS AS FOLLOWS, BUT NOT LIMITED TO:  LEFT GREAT TOE DISLOCATION        Medical Decision Making  Problems Addressed:  Closed dislocation of interphalangeal joint of foot, initial encounter: acute illness or injury    Amount and/or Complexity of Data Reviewed  Radiology: ordered and independent interpretation performed. Decision-making details documented in ED Course.    Risk  Prescription drug management.  Diagnosis or treatment significantly limited by social determinants of health.      CLINICAL IMPRESSION  Clinical Impression   Closed dislocation of interphalangeal joint of foot, initial encounter - LEFT, FIRST DIGIT, WITH SMALL FRACTURES (Primary)     DISPOSITION  Discharged       DISCHARGE MEDICATIONS  Current Discharge Medication List          /Deanna Fiorenza A. Yeguada, DO, MBA   02/14/2023, 10:25   [facility]  Department of Emergency Medicine  [facility]    This note was partially generated using MModal Fluency Direct system, and there may be some incorrect words,  spellings, and punctuation that were not noted in checking the note before saving.

## 2023-02-14 NOTE — ED Nurses Note (Signed)
Patient discharged home with family.  AVS reviewed with patient/care giver.  A written copy of the AVS and discharge instructions was given to the patient/care giver.  Questions sufficiently answered as needed.  Patient/care giver encouraged to follow up with PCP as indicated.  In the event of an emergency, patient/care giver instructed to call 911 or go to the nearest emergency room.  Patient ambulated w/o assist, pt left ED via WC per request.

## 2023-02-14 NOTE — Discharge Instructions (Addendum)
THE PATIENT IS TO FOLLOW UP WITH THE PRIMARY CARE PROVIDER AS SOON AS POSSIBLE BUT NO LATER THAN 3 DAYS FROM LEAVING THE EMERGENCY DEPARTMENT.  IF NO PRIMARY CARE PROVIDER EXISTS, THEN THE PATIENT IS INSTRUCTED TO ESTABLISH CARE WITH A PRIMARY CARE PROVIDER AS SOON AS POSSIBLE BUT NO LATER THAN 3 DAYS FROM LEAVING THE EMERGENCY DEPARTMENT.  FOLLOW-UP WITH ANY SPECIALIST PROVIDER AS INDICATED AS SOON AS POSSIBLE BUT NO LATER THAN 3 DAYS, IF APPLICABLE.  NOTIFY THE PRIMARY CARE PROVIDER THAT YOU WERE IN THE EMERGENCY DEPARTMENT WITHIN 24 HOURS OF DISCHARGE TO FOLLOW-UP ON YOUR RESULTS AND/OR TREATMENTS.  RETURN TO THE EMERGENCY DEPARTMENT IMMEDIATELY IF NEEDED, NO BETTER, WORSE, NEW SYMPTOMS ARISE, OR YOU CANNOT FOLLOW-UP WITH YOUR PRIMARY CARE PROVIDER AND/OR APPLICABLE SPECIALIST IN THE PRESCRIBED TIMEFRAME.    MAY TAKE OVER-THE-COUNTER TYLENOL AND IBUPROFEN AS NEEDED FOR PAIN.  MAY ICE THE AFFECTED AREA TO REDUCE SWELLING AND PAIN.

## 2023-02-20 ENCOUNTER — Other Ambulatory Visit (INDEPENDENT_AMBULATORY_CARE_PROVIDER_SITE_OTHER): Payer: Self-pay | Admitting: NURSE PRACTITIONER

## 2023-02-20 MED ORDER — SACUBITRIL 24 MG-VALSARTAN 26 MG TABLET
1.0000 | ORAL_TABLET | Freq: Two times a day (BID) | ORAL | 2 refills | Status: DC
Start: 2023-02-20 — End: 2023-03-13

## 2023-02-20 MED ORDER — SPIRONOLACTONE 25 MG TABLET
25.0000 mg | ORAL_TABLET | Freq: Every morning | ORAL | 2 refills | Status: DC
Start: 2023-02-20 — End: 2023-06-10

## 2023-03-13 ENCOUNTER — Other Ambulatory Visit (INDEPENDENT_AMBULATORY_CARE_PROVIDER_SITE_OTHER): Payer: Self-pay | Admitting: NURSE PRACTITIONER

## 2023-03-13 MED ORDER — SACUBITRIL 24 MG-VALSARTAN 26 MG TABLET
1.0000 | ORAL_TABLET | Freq: Two times a day (BID) | ORAL | 2 refills | Status: DC
Start: 2023-03-13 — End: 2023-06-10

## 2023-06-10 ENCOUNTER — Ambulatory Visit: Payer: Medicare Other | Attending: NURSE PRACTITIONER | Admitting: NURSE PRACTITIONER

## 2023-06-10 ENCOUNTER — Encounter (INDEPENDENT_AMBULATORY_CARE_PROVIDER_SITE_OTHER): Payer: Self-pay | Admitting: NURSE PRACTITIONER

## 2023-06-10 ENCOUNTER — Other Ambulatory Visit: Payer: Self-pay

## 2023-06-10 VITALS — BP 120/64 | HR 80 | Ht 62.0 in | Wt 126.0 lb

## 2023-06-10 DIAGNOSIS — I110 Hypertensive heart disease with heart failure: Secondary | ICD-10-CM | POA: Insufficient documentation

## 2023-06-10 DIAGNOSIS — I5042 Chronic combined systolic (congestive) and diastolic (congestive) heart failure: Secondary | ICD-10-CM | POA: Insufficient documentation

## 2023-06-10 DIAGNOSIS — I447 Left bundle-branch block, unspecified: Secondary | ICD-10-CM | POA: Insufficient documentation

## 2023-06-10 DIAGNOSIS — Z7984 Long term (current) use of oral hypoglycemic drugs: Secondary | ICD-10-CM | POA: Insufficient documentation

## 2023-06-10 DIAGNOSIS — I10 Essential (primary) hypertension: Secondary | ICD-10-CM | POA: Insufficient documentation

## 2023-06-10 DIAGNOSIS — I351 Nonrheumatic aortic (valve) insufficiency: Secondary | ICD-10-CM | POA: Insufficient documentation

## 2023-06-10 DIAGNOSIS — Z79899 Other long term (current) drug therapy: Secondary | ICD-10-CM | POA: Insufficient documentation

## 2023-06-10 DIAGNOSIS — E782 Mixed hyperlipidemia: Secondary | ICD-10-CM | POA: Insufficient documentation

## 2023-06-10 DIAGNOSIS — I428 Other cardiomyopathies: Secondary | ICD-10-CM | POA: Insufficient documentation

## 2023-06-10 MED ORDER — SIMVASTATIN 20 MG TABLET
20.0000 mg | ORAL_TABLET | Freq: Every evening | ORAL | 3 refills | Status: AC
Start: 2023-06-10 — End: ?

## 2023-06-10 MED ORDER — CARVEDILOL 3.125 MG TABLET
3.1250 mg | ORAL_TABLET | Freq: Two times a day (BID) | ORAL | 3 refills | Status: AC
Start: 2023-06-10 — End: ?

## 2023-06-10 MED ORDER — EMPAGLIFLOZIN 10 MG TABLET
10.0000 mg | ORAL_TABLET | Freq: Every day | ORAL | 2 refills | Status: AC
Start: 2023-06-10 — End: 2024-03-06

## 2023-06-10 MED ORDER — SACUBITRIL 24 MG-VALSARTAN 26 MG TABLET
1.0000 | ORAL_TABLET | Freq: Two times a day (BID) | ORAL | 3 refills | Status: AC
Start: 2023-06-10 — End: ?

## 2023-06-10 MED ORDER — SPIRONOLACTONE 25 MG TABLET
25.0000 mg | ORAL_TABLET | Freq: Every morning | ORAL | 3 refills | Status: AC
Start: 2023-06-10 — End: ?

## 2023-06-10 NOTE — Progress Notes (Signed)
[facility]    Deanna Flowers, 72 y.o. female  Date of Service: 06/10/2023  Date of Birth:  June 05, 1951  PCP:  Emilia Beck, DO  Chief Complaint   Patient presents with    Follow Up 6 Months     LBBB  Non-ischemic cardiomyopathy    CHF        HPI:    The patient has a history of hypertension, hyperlipidemia, left bundle-branch block, and anxiety. She was also diagnosed with dementia approximately 4 years ago.  She had an abnormal moderate risk stress test in March 23, 2022 that showed global hypokinesia in the anteroapical region with an EF of 48%.  She underwent heart catheterization in December 2023 that showed angiographically normal coronary arteries, EF 30%, nonischemic cardiomyopathy.  She was started on GDMT for heart failure.  Repeat echo April 2023 showed preserved EF greater than 60%, moderate AR.  She does have some dementia.    06/10/23 The patient is here for routine f/u for NICM.  She reports no trouble with chest pain or shortness of breath.  Denies any lower extremity edema.  She does report some palpitations sometimes when she over exerts but otherwise no complaints.      EKG:  NSR, rate 79, left bundle-branch block  LAB:  Labs in April showed total cholesterol 150, triglycerides 145, HDL 52, LDL 73, BUN 10, creatinine 0.78, potassium 3.7.      Past Medical History:   Diagnosis Date    Abnormal cardiovascular stress test 03/28/2022    [place] [facility]    Esophageal reflux     Essential hypertension     Herpes simplex     Memory impairment     Mixed hyperlipidemia     Osteoporosis     Restless leg     Vitamin D deficiency        History reviewed. No past surgical history pertinent negatives.    Current Outpatient Medications   Medication Sig    buPROPion (WELLBUTRIN XL) 300 mg extended release 24 hr tablet 1 Tablet (300 mg total) Once a day    carvediloL (COREG) 3.125 mg Oral Tablet Take 1 Tablet (3.125 mg total) by mouth Twice daily with food Indications: heart failure with reduced  ejection fraction due to dilated cardiomyopathy    chlorzoxazone (PARAFON FORTE) 500 mg Oral Tablet Take 1 Tablet (500 mg total) by mouth Every 8 hours as needed    DETROL LA 4 mg Oral Capsule, Sust. Release 24 hr 1 Capsule (4 mg total) Every night    donepeziL (ARICEPT) 10 mg Oral Tablet Take 1 Tablet (10 mg total) by mouth Every night 1 tablet in am and 0.5mg  tab in pm    empagliflozin (JARDIANCE) 10 mg Oral Tablet Take 1 Tablet (10 mg total) by mouth Once a day for 270 days    escitalopram oxalate (LEXAPRO) 20 mg Oral Tablet Take 1 Tablet (20 mg total) by mouth Once a day (Patient not taking: Reported on 06/10/2023)    memantine (NAMENDA) 10 mg Oral Tablet 1 Tablet (10 mg total) Twice daily    pantoprazole (PROTONIX) 40 mg Oral Tablet, Delayed Release (E.C.) 1 Tablet (40 mg total) Once a day    raloxifene (EVISTA) 60 mg Oral Tablet 1 Tablet (60 mg total) Once a day    risperiDONE (RISPERDAL) 0.25 mg Oral Tablet Take 1 Tablet (0.25 mg total) by mouth Once a day    rOPINIRole (REQUIP) 1 mg Oral Tablet 1 Tablet (1  mg total) Twice daily    sacubitriL-valsartan (ENTRESTO) 24-26 mg Oral Tablet Take 1 Tablet by mouth Twice daily    simvastatin (ZOCOR) 20 mg Oral Tablet Take 1 Tablet (20 mg total) by mouth Every evening    spironolactone (ALDACTONE) 25 mg Oral Tablet Take 1 Tablet (25 mg total) by mouth Every morning before breakfast    valACYclovir (VALTREX) 500 mg Oral Tablet 1 Tablet (500 mg total) Once a day    venlafaxine (EFFEXOR XR) 75 mg Oral Capsule, Sust. Release 24 hr 1 Capsule (75 mg total) Twice daily Pt takes 2 every morning and 1 at night,     ROS: Other than issues noted in HPI, all other systems were negative.     Exam:  Vitals:    06/10/23 0838   BP: 120/64   Pulse: 80   SpO2: 96%   Weight: 57.2 kg (126 lb)   Height: 1.575 m (5\' 2" )   BMI: 23.09       General: No acute distress and appears stated age.    HEENT:Head normocephalic, atraumatic. ENT without erythema or injection, mucouse membranes moist.     Neck: No JVD, no carotid bruit. and supple, symmetrical, trachea midline.   Lungs: Clear to auscultation bilaterally.    Cardiovascular: Regular rate and rhythm, normal S1 S2, grade 1/6 systolic murmur, no rub, or gallop, no thrill     Abdomen: Soft, non-tender and bowel sounds normal.    Extremities: Extremities normal, atraumatic, no cyanosis or edema.    Skin: Skin warm and dry.    Neurologic: Alert and oriented x3.  Psych: Mood and affect congruent for age and gender     Orders placed this visit:  Orders Placed This Encounter    EKG (In-Clinic Today)    carvediloL (COREG) 3.125 mg Oral Tablet    empagliflozin (JARDIANCE) 10 mg Oral Tablet    sacubitriL-valsartan (ENTRESTO) 24-26 mg Oral Tablet    spironolactone (ALDACTONE) 25 mg Oral Tablet    simvastatin (ZOCOR) 20 mg Oral Tablet       Assessment/Plan:  Nonischemic cardiomyopathy (CMS HCC)    Chronic combined systolic and diastolic CHF, NYHA class 2 (CMS HCC)    Left bundle branch block    Aortic regurgitation    Essential hypertension    Mixed hyperlipidemia    Continue carvedilol 3.125 mg b.i.d.   Continue Jardiance 10 mg daily   Continue Entresto 24/26 mg b.i.d.   Continue Aldactone 25 mg daily   Continue Zocor 20 mg daily    Patient is on good medical therapy for heart failure.  Continue current regimen.  Symptoms stable.  Repeat echo every 1-2 years for cardiomyopathy and AR.  Return in 6 months for routine follow-up.    Amedeo Kinsman, APRN,FNP-BC 06/10/2023 09:06

## 2023-06-25 ENCOUNTER — Other Ambulatory Visit: Payer: Self-pay

## 2023-06-25 ENCOUNTER — Emergency Department
Admission: EM | Admit: 2023-06-25 | Discharge: 2023-06-25 | Disposition: A | Discharge status: Home / Self Care | Payer: Medicare Other

## 2023-06-25 DIAGNOSIS — S80811A Abrasion, right lower leg, initial encounter: Secondary | ICD-10-CM | POA: Insufficient documentation

## 2023-06-25 DIAGNOSIS — W5503XA Scratched by cat, initial encounter: Secondary | ICD-10-CM | POA: Insufficient documentation

## 2023-06-25 DIAGNOSIS — F0390 Unspecified dementia without behavioral disturbance: Secondary | ICD-10-CM | POA: Insufficient documentation

## 2023-06-25 DIAGNOSIS — S80812A Abrasion, left lower leg, initial encounter: Secondary | ICD-10-CM | POA: Insufficient documentation

## 2023-06-25 MED ORDER — CLINDAMYCIN HCL 300 MG CAPSULE
300.0000 mg | ORAL_CAPSULE | Freq: Four times a day (QID) | ORAL | 0 refills | Status: AC
Start: 2023-06-25 — End: 2023-07-05

## 2023-06-25 MED ORDER — BACITRACIN ZINC 500 UNIT-POLYMYXIN B 10,000 UNIT/GRAM TOP OINT PACKET
1.0000 | TOPICAL_OINTMENT | CUTANEOUS | Status: AC
Start: 2023-06-25 — End: 2023-06-25
  Administered 2023-06-25: 1 via TOPICAL

## 2023-06-25 MED ORDER — MUPIROCIN 2 % TOPICAL OINTMENT
TOPICAL_OINTMENT | CUTANEOUS | Status: AC
Start: 2023-06-25 — End: 2023-06-25
  Filled 2023-06-25: qty 22

## 2023-06-25 MED ORDER — CLINDAMYCIN HCL 150 MG CAPSULE
ORAL_CAPSULE | ORAL | Status: AC
Start: 2023-06-25 — End: 2023-06-25
  Filled 2023-06-25: qty 2

## 2023-06-25 MED ORDER — NEOMYCIN-BACITRACN ZN-POLYMYXN 3.5 MG-400 UNIT-5,000 UNIT TOP OINT PKT
1.0000 | TOPICAL_OINTMENT | CUTANEOUS | Status: DC
Start: 2023-06-25 — End: 2023-06-25

## 2023-06-25 MED ORDER — CLINDAMYCIN HCL 150 MG CAPSULE
300.0000 mg | ORAL_CAPSULE | ORAL | Status: AC
Start: 2023-06-25 — End: 2023-06-25
  Administered 2023-06-25: 300 mg via ORAL

## 2023-06-25 NOTE — ED Nurses Note (Signed)
Patient discharged home with family. AVS reviewed with patient. A written copy of the AVS and discharge instructions were given to the patient. Questions sufficiently answered as needed. Patient encouraged to follow up with PCP as indicated. In the event of an emergency, patient instructed to call 911 or go to the nearest emergency room. Patient's wounds cleaned, Polysporin applied, and sterile dressings applied to bilateral lower legs. Instructions given. Patient left department via wheelchair with family.

## 2023-06-25 NOTE — ED Triage Notes (Signed)
"  She jumped into the middle of a cat fight", the cat belongs to the pt,  cat scratches to bilateral lower extremities, cat is UTD on vaccines, seen here a couple of weeks ago for same c/o

## 2023-06-25 NOTE — ED Provider Notes (Signed)
[facility]  ED Primary Provider Note      Name: Deanna Flowers  Age and Gender: 72 y.o. female  Date of Birth: 13-Jul-1951  MRN: [id number]  PCP: Emilia Beck, DO    CC:  Chief Complaint   Patient presents with    Cat scratch       HPI:  Deanna Flowers is a 72 y.o. White female who presents to the ER with cat scratch that happened approximately 4 hours ago..  Patient has a history of dementia, son states that she got in middle of to cats fighting and the CT scratched up bilateral legs.  Son states that she had her tetanus updated 2 weeks ago when this happened then as well.  Bleeding was controlled in triage.  Denies any fever, nausea vomiting diarrhea chest pain or shortness of breath.    Below pertinent information reviewed with patient:  Past Medical History:   Diagnosis Date    Abnormal cardiovascular stress test 03/28/2022    [place] [facility]    Esophageal reflux     Essential hypertension     Herpes simplex     Memory impairment     Mixed hyperlipidemia     Osteoporosis     Restless leg     Vitamin D deficiency        Allergies   Allergen Reactions    Penicillins     Sulfa (Sulfonamides)        Past Surgical History:   Procedure Laterality Date    CORONARY ANGIOGRAPHY W/LEFT HEART CATH W/WO LVG N/A 07/16/2022    Performed by Elam Dutch, MD at [facility]        Social History     Socioeconomic History    Marital status: Widowed   Tobacco Use    Smoking status: Never    Smokeless tobacco: Never   Vaping Use    Vaping status: Never Used   Substance and Sexual Activity    Alcohol use: Never    Drug use: Never       ROS:  No other overt positive review of systems are noted other than stated in the HPI.      Objective:    ED Triage Vitals [06/25/23 0155]   BP (Non-Invasive) (!) 140/55   Heart Rate 80   Respiratory Rate 20   Temperature 36.3 C (97.4 F)   SpO2 100 %   Weight 57.2 kg (126 lb)   Height 1.575 m (5\' 2" )     Filed Vitals:    06/25/23 0155 06/25/23 0217    BP: (!) 140/55 136/60   Pulse: 80 84   Resp: 20 20   Temp: 36.3 C (97.4 F) 36.4 C (97.5 F)   SpO2: 100% 100%       Nursing notes and vital signs reviewed.    Constitutional - No acute distress.  Alert and Active.  HEENT - Normocephalic. Atraumatic. PERRL. EOMI. Conjunctiva clear.  Moist mucous membranes.   Neck - Trachea midline. No stridor. No hoarseness.  Cardiac - Regular rate and rhythm. No murmurs, rubs, or gallops. Intact distal pulses.  Respiratory/Chest - Normal respiratory effort. Clear to auscultation bilaterally. No rales, wheezes or rhonchi. No chest tenderness.  Abdomen - Normal bowel sounds. Non-tender, soft, non-distended. No rebound or guarding.   Musculoskeletal - Good AROM. No muscle or joint tenderness appreciated. No clubbing, cyanosis or edema.  Skin - Warm and dry.  Multiple scratches with  abrasions noted to bilateral legs.  Neuro - Alert and oriented x 3. Cranial nerves II-XII are grossly intact.  Moving all extremities symmetrically. Normal gait.  Psych - Normal mood and affect. Behavior is normal          Any pertinent labs and imaging obtained during this encounter reviewed below in MDM.    MDM/ED Course:    Medical Decision Making  Patient presents to the ER with cat scratch that happened approximately 4 hours ago..  Patient has a history of dementia, son states that she got in middle of to cats fighting and the CT scratched up bilateral legs.  Son states that she had her tetanus updated 2 weeks ago when this happened then as well.  Bleeding was controlled in triage.  Denies any fever, nausea vomiting diarrhea chest pain or shortness of breath.  Bilateral lower extremities were thoroughly cleaned up with Betadine, Neosporin was placed on them and nonadherent dressings.  Patient was given clindamycin 300 mg in the department.  Tetanus shot was already up-to-date.  Strict ER return precautions were given.  Son states that he will ensure that patient takes the antibiotic 4 times daily.   Patient has a penicillin allergy so we are giving clindamycin.  ER attending also assess the patient and agrees with treatment plan.  Patient and family were agreeable to treatment course patient is stable and suitable for discharge.    Risk  OTC drugs.               Orders Placed This Encounter    clindamycin (CLEOCIN) capsule    clindamycin (CLEOCIN) 300 mg Oral Capsule    bacitracin zinc-polymyxin b (POLYSPORIN) topical ointment packet     Impression:   Clinical Impression   Cat scratch of lower leg, unspecified laterality, initial encounter (Primary)     Disposition: Discharged    / M. Toney Sang, APRN, FNP-C 06/25/2023, 02:03  [facility]  Department of Emergency Medicine  [facility]    Portions of this note may have been dictated using voice recognition software.     -----------------------  No results found for this or any previous visit (from the past 12 hour(s)).  No orders to display

## 2023-06-25 NOTE — Discharge Instructions (Addendum)
Take clindamycin 4 times a day for the next 10 days  Apply over-the-counter antibiotic ointment twice a day for the next 10 days (bacitracin or Neosporin)  Over-the-counter Tylenol/ibuprofen as needed for discomfort  Follow up with the family doctor for recheck on Monday/Tuesday  Return to emergency room for any fever, drainage, increased redness/swelling, or any concerns

## 2023-06-25 NOTE — ED Nurses Note (Addendum)
Patient to ED14 with family after cat scratch at this time. Patient states she was trying to "break up" a fight between two of her cats. Cats are owned by the patient and are up-to-date on vaccines. Scratches noted to patient's bilateral lower legs. Patient denies any other symptoms, including other areas of injury. States was here a couple weeks ago for cat scratches also. VSS. Respirations even and unlabored. Family at bedside. Call bell within reach.

## 2023-07-01 LAB — ECG W INTERP (AMB USE ONLY)(MUSE,IN CLINIC)
Atrial Rate: 79 {beats}/min
Calculated P Axis: 53 degrees
Calculated R Axis: -2 degrees
Calculated T Axis: 132 degrees
PR Interval: 128 ms
QRS Duration: 136 ms
QT Interval: 430 ms
QTC Calculation: 493 ms
Ventricular rate: 79 {beats}/min

## 2023-07-07 ENCOUNTER — Emergency Department (HOSPITAL_COMMUNITY): Payer: Medicare Other

## 2023-07-07 ENCOUNTER — Other Ambulatory Visit: Payer: Self-pay

## 2023-07-07 ENCOUNTER — Emergency Department
Admission: EM | Admit: 2023-07-07 | Discharge: 2023-07-08 | Disposition: A | Discharge status: Other Acute Inpatient Hospital | Payer: Medicare Other | Source: Home / Self Care | Attending: Emergency Medicine | Admitting: Emergency Medicine

## 2023-07-07 DIAGNOSIS — S0083XA Contusion of other part of head, initial encounter: Secondary | ICD-10-CM

## 2023-07-07 DIAGNOSIS — F03A0 Unspecified dementia, mild, without behavioral disturbance, psychotic disturbance, mood disturbance, and anxiety: Secondary | ICD-10-CM | POA: Insufficient documentation

## 2023-07-07 DIAGNOSIS — S12000A Unspecified displaced fracture of first cervical vertebra, initial encounter for closed fracture: Secondary | ICD-10-CM

## 2023-07-07 DIAGNOSIS — S12091A Other nondisplaced fracture of first cervical vertebra, initial encounter for closed fracture: Secondary | ICD-10-CM | POA: Insufficient documentation

## 2023-07-07 DIAGNOSIS — W109XXA Fall (on) (from) unspecified stairs and steps, initial encounter: Secondary | ICD-10-CM | POA: Insufficient documentation

## 2023-07-07 DIAGNOSIS — W010XXA Fall on same level from slipping, tripping and stumbling without subsequent striking against object, initial encounter: Secondary | ICD-10-CM

## 2023-07-07 DIAGNOSIS — S0081XA Abrasion of other part of head, initial encounter: Secondary | ICD-10-CM

## 2023-07-07 MED ORDER — HYDROMORPHONE 2 MG/ML INJECTION WRAPPER
0.5000 mg | INJECTION | INTRAMUSCULAR | Status: AC
Start: 2023-07-07 — End: 2023-07-07
  Administered 2023-07-07: 0.5 mg via INTRAVENOUS

## 2023-07-07 MED ORDER — ONDANSETRON HCL (PF) 4 MG/2 ML INJECTION SOLUTION
4.0000 mg | INTRAMUSCULAR | Status: AC
Start: 2023-07-07 — End: 2023-07-07
  Administered 2023-07-07: 4 mg via INTRAVENOUS

## 2023-07-07 MED ORDER — HYDROMORPHONE 2 MG/ML INJECTION WRAPPER
INJECTION | INTRAMUSCULAR | Status: AC
Start: 2023-07-07 — End: 2023-07-07
  Filled 2023-07-07: qty 1

## 2023-07-07 MED ORDER — ONDANSETRON HCL (PF) 4 MG/2 ML INJECTION SOLUTION
INTRAMUSCULAR | Status: AC
Start: 2023-07-07 — End: 2023-07-07
  Filled 2023-07-07: qty 2

## 2023-07-07 NOTE — ED Triage Notes (Signed)
EMS called for fall from standing. Pt had made it up the steps, hit the landing, and hit face on the concrete. Pt denies LOC. Does c/o neck pain, and hematoma to above right eye. C-collar in use. BS 129.

## 2023-07-07 NOTE — ED Nurses Note (Signed)
Report called to [facility].

## 2023-07-07 NOTE — ED Provider Notes (Signed)
CHIEF COMPLAINT  Chief Complaint   Patient presents with    Fall    Facial Injury    Neck Pain     HISTORY OF PRESENT ILLNESS  Deanna Flowers, date of birth Jan 14, 1951, is a 72 y.o. female who presented to the Emergency Department complain of head injury.  She has mild dementia she tripped up the stairs striking her forehead on the concrete.  She denies any other injury she did not not hold her hands out in front of her she simply landed on her forehead.  She has not occipital headache and spine pain .    PAST MEDICAL/SURGICAL/FAMILY/SOCIAL HISTORY  Past Medical History:   Diagnosis Date    Abnormal cardiovascular stress test 03/28/2022    [place] [facility]    Esophageal reflux     Essential hypertension     Herpes simplex     Memory impairment     Mixed hyperlipidemia     Osteoporosis     Restless leg     Vitamin D deficiency            Family Medical History:       Problem Relation (Age of Onset)    Diabetes Brother    Hypertension (High Blood Pressure) Mother    Prostate Cancer Father          Social History     Socioeconomic History    Marital status: Widowed   Tobacco Use    Smoking status: Never    Smokeless tobacco: Never   Vaping Use    Vaping status: Never Used   Substance and Sexual Activity    Alcohol use: Never    Drug use: Never      ALLERGIES  Allergies   Allergen Reactions    Penicillins     Sulfa (Sulfonamides)        PHYSICAL EXAM  VITAL SIGNS:  Filed Vitals:    07/07/23 2045 07/07/23 2100 07/07/23 2115 07/07/23 2130   BP: (!) 126/59 121/78 132/60 100/78   Pulse: 96 86 84 92   Resp: 16 15 20 14    Temp:       SpO2: 96% 98% 96% 93%     GENERAL: PATIENT IS ALERT AND ORIENTED TO PERSON, PLACE, AND TIME.  IN NO DISTRESS.  GCS is 15  HEAD: NORMOCEPHALIC , she has a large contusion to her forehead over the right eye.  With a mild abrasion  EYES: PUPILS EQUALLY ROUND AND REACT TO LIGHT. EXTRAOCULAR MOVEMENTS INTACT.  EARS: GROSS HEARING INTACT. EXTERNAL EARS WITHIN NORMAL LIMITS.  THROAT: MOIST ORAL  MUCOSA. NO ERYTHEMA OR EXUDATE OF THE PHARYNX.  NECK: SUPPLE. TRACHEA MIDLINE.  NO LYMPHADENOPATHY  CARDIOVASCULAR: REGULAR, RATE, AND RHYTHM. NO MURMUR.  LUNGS: CLEAR TO AUSCULTATION BILATERAL.  EXTREMITIES: NO CYANOSIS, CLUBBING, OR EDEMA.  NO GROSS DEFORMITIES, MOVES ALL 4 EXTREMITIES  SKIN: WARM AND DRY.  NEUROLOGIC: CRANIAL NERVES II THROUGH XII ARE GROSSLY INTACT ALTHOUGH NOT INDIVIDUALLY TESTED.  No gross motor deficits  PSYCHIATRIC: JUDGMENT AND INSIGHT ARE SEEMINGLY INTACT. MOOD AND AFFECT ARE APPROPRIATE FOR THE SITUATION.    PROCEDURES  DIAGNOSTICS  Labs:  Labs listed below were reviewed and interpreted by me.  No results found for any visits on 07/07/23.  Radiology:  Results for orders placed or performed during the hospital encounter of 07/07/23   CT BRAIN WO IV CONTRAST     Status: None    Narrative    Casi L Cisnero  RADIOLOGIST: Elna Breslow, MD    CT BRAIN WO IV CONTRAST performed on 07/07/2023 9:02 PM    CLINICAL HISTORY: Blunt head trauma.  Blunt head trauma, bruising over right eye    TECHNIQUE:  Head CT without intravenous contrast.    COMPARISON: None.    FINDINGS:  There is no acute intracranial hemorrhage, mass effect, or evidence of large acute infarct.    Brain: There is some hyperlucency in the periventricular and subcortical white matter compatible small vessel ischemic change.    CSF Spaces: Normal     Sinuses/Mastoids:  Clear at visualized levels     Bones: Unremarkable  Soft tissue swelling overlying the right frontal bone extending over the lateral right orbit in the upper right eyelid.      Impression    NO ACUTE FINDINGS      One or more dose reduction techniques were used (e.g., Automated exposure control, adjustment of the mA and/or kV according to patient size, use of iterative reconstruction technique).      Radiologist location ID: [id number]     CT CERVICAL SPINE WO IV CONTRAST     Status: None    Narrative    Kaybree L Hornberger    RADIOLOGIST: Elna Breslow,  MD    CT CERVICAL SPINE WO IV CONTRAST performed on 07/07/2023 9:05 PM    CLINICAL HISTORY: Fall, hit front of the head now has cervical spine pain no bony tenderness.  Fall, hit front of the head now has cervical spine pain no bony tenderness    TECHNIQUE:  Cervical spine CT without contrast.      COMPARISON: None.    FINDINGS:  Alignment: Normal    Vertebrae: There is a defect within the left half of the anterior arch of C1 is nondisplaced. Suspected to be a fracture. Could BE acute or chronic.    No other fracture.    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    Soft Tissues:   No large prevertebral hematoma        Impression    Nondisplaced fracture involving the left half of the anterior arch of T1. Could be acute but its chronicity is uncertain. No soft tissue swelling. No subluxation.      One or more dose reduction techniques were used (e.g., Automated exposure control, adjustment of the mA and/or kV according to patient size, use of iterative reconstruction technique).      Radiologist location ID: [id number]         ED COURSE/MEDICAL DECISION MAKING  Medications Administered in the ED   HYDROmorphone (DILAUDID) 2 mg/mL injection (0.5 mg Intravenous Given 07/07/23 2147)   ondansetron (ZOFRAN) 2 mg/mL injection (4 mg Intravenous Given 07/07/23 2146)          Medical Decision Making  Differential diagnosis includes concussion, skull fracture, intracerebral hemorrhage, cervical spine strain or sprain, sore spine fracture    CT scan with a C1 anterior arch fracture patient is neurologically intact we will discuss with [facility] regarding transfer    Patient has been accepted by Dr. Jimmey Ralph ED to ED      CRITICAL CARE    CLINICAL IMPRESSION  Clinical Impression   C1 cervical fracture (CMS HCC) (Primary)     DISPOSITION  Transfered to Another Facility       DISCHARGE MEDICATIONS  Current Discharge Medication List          Gala Romney Kinnie Scales M.D.   07/07/2023, 21:07   [facility]  Department of Emergency  Medicine  [facility]    This note was partially generated using MModal Fluency Direct system, and there may be some incorrect words, spellings, and punctuation that were not noted in checking the note before saving.    -----

## 2023-07-08 NOTE — ED Nurses Note (Signed)
EMS here to transport pt to [facility].

## 2023-07-11 NOTE — Discharge Summary (Signed)
-------------------------------------------------------------------------------  Attestation signed by Youlanda Roys, MD at 07/11/2023  3:14 PM  A resident and/or student assisted with the documentation of this service.  On the day of discharge I spent 35 minutes examining patient, educating patient about the hospital course and discharge plans, answering patient's questions, discussing with patient's nurse and other members of the healthcare team, and reviewing/amending the medicine reconciliation, discharge instructions, and discharge summary.  I have reviewed the resident's and/or student's note and agree with the content and plan as written.      Pearline Cables, MD        -------------------------------------------------------------------------------    Discharge Summary  [facility] Internal Medicine Res[facility]    Name: Deanna Flowers  Date of Birth: 1950/09/03 (Age: 72 y.o.)   Date of Admission: 07/08/2023  Date of Discharge: 07/11/2023  Primary Resident: Tamela Oddi, MD  Primary Intern: Carlis Abbott, DO  Attending Physician: Youlanda Roys, MD  Primary Care Provider: Daphine Deutscher    Primary Discharge Diagnosis:     Fracture of anterior arch of C1, closed    Secondary Discharge Diagnoses:  Principal Problem:    Fracture of anterior arch of C1, closed, initial encounter (HCC)  Active Problems:    Alcohol withdrawal syndrome, uncomplicated (HCC)    Hypokalemia    Consultants:  CONSULT TO NEUROSURGERY  CONSULT TO ORTHOTIST  CONSULT TO GERIATRIC MEDICINE  CONSULT TO SOCIAL WORK SERVICES  CONSULT TO CASE MANAGEMENT  CONSULT TO SOCIAL WORK: DISHARGE PLANNING     Recommendations to Patient Care Team:     Recommended Follow-up Tests:  Follow up with Neurosurgery      Pending Test Results (not available at the time of this Summary):  None     Medication Changes:   Discontinue AM dosage of Aricpet; continue 10 mg QHS dosing   Effexor changed to 225 mg QAM   Consider  reducing Wellbutrin dose if issues with insomnia (will follow)   Tylenol 1000 mg TID (agree with scheduled given fall)   Ramelteon 8 mg QHS   Avoid muscle relaxers     Other instructions and counseling:  Please return to ED or call your physician if you have:     1. Fevers > 100.8 unresponsive to tylenol.     2. Abdominal pain and/or distention     3. Intractable nausea, vomiting or diarrhea     4. Inability to tolerate adequate oral intake of food     5. Neurologic changes, chest pain, or shortness of breath  It is critical that you make your follow-up appointment(s).   If you are discharged on the weekend or after business hours, or if we are unable to schedule these appointments for you for any reason, you or a family member need to call during the next business day to schedule your appointment(s).  For any questions or concerns please call your discharging physician, Joen Laura, DO, through the [facility] Operator, [phone], or your Primary Care Provider, Daphine Deutscher       Major Procedures & Tests:    Xray Cspine 07/08/23  IMPRESSION:   There is no frank widening of the lateral masses from the dens. Otherwise normal atlantoaxial alignment.   C-collar is noted. Degenerative changes as seen on the recent CT. The known C1 arch fracture is not identified with the open-mouth odontoid view very limited. Normal retropharyngeal soft tissues.     CTA neck and head 07/08/23  IMPRESSION:   1. Unremarkable CTA of the head and neck.   2. Unremarkable CT of the head.    CT Cervical spine wo contrast 07/07/23  FINDINGS:   Alignment: Normal     Vertebrae: There is a defect within the left half of the anterior arch of C1 is nondisplaced. Suspected to be a fracture. Could BE acute or chronic.     No other fracture.     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     Soft Tissues:   No large prevertebral hematoma     Brief Admission History/Reason for Admission:   As per Dr. Enos Fling History and  Physical (07/08/23):    - Deanna Flowers is a 72 y.o. female who presents as a transfer from [facility] after all fall. Found to have a C-spine fx and transferred to [facility] for neurosurgery eval  - At [facility], neurosurgery consulted. Patient in C-collar. Currently awaiting C-spine x-ray and additional recs  - Noted to be hypokalemic at 2.3.   - Spoke with family member (son) at bedside. He notes that patient currently lives alone in apartment complex. He lives a few stories above her and has diligently cared for her and attempted to prevent her from going into a nursing home. Notes she was found this morning in 60F wearing a nightgown and notes this in conjunction with fall and other difficulties maintaining ADLs as reason why he would like her to be move dto a long term care facility.     Brief Hospital Course by Main Problems:      Patient is a 72yoF w a PMHx of Alzheimer's, HFrEF, HTN, HLD, GERD and RLS who presented as a facility transfer for neurosurgery evaluation of a C1 fracture s/p fall. Neurosurgery consulted, did not recommend immediate neurosurgical intervention. Advised follow-up with Neurosurgery in 6 weeks with repeat imaging. She was placed in a C-collar, which is to remain on at all times. Pain was well controlled by scheduled Tylenol 1g TID. Geriatric medicine was consulted and made changes to her medications as listed in changes. She is medically stable and pending authorization for post-acute rehab.     Discharge Exam:  BP (!) 149/58 (BP Location: Left arm, Patient Position: Supine)   Pulse 79   Temp 97.8 F (36.6 C)   Resp 17   Ht 1.6 m (5\' 3" )   Wt 58 kg (127 lb 13.9 oz)   SpO2 94%   BMI 22.65 kg/m       Physical Exam   GEN: WNWD  NECK:  No thyromegally, in C-collar  HEART: RRR without MRG, no edema  LUNGS: CTAB  ABD: NABS, NT, ND, no rebound or guarding  PSYCH:  Alert and oriented, no thought disturbances, normal mood, good eye contact    Discharge/Recent Laboratory  Results:  Recent Labs     07/11/23  0526   NA 141   K 3.8   CL 106   CO2 28   BUN 16   CREATININE 0.58   GLU 104   CA 8.6   MG 1.9     Recent Labs     07/11/23  0526   HGB 12.1   HCT 38.8   WBC 6.3   PLT 349        Medication List        START taking these medications      ergocalciferol 1,250 mcg (50,000 unit) Cap  Commonly known as: ERGOCALCIFEROL  take 1 capsule once  a week by mouth .  Start taking on: July 16, 2023     ramelteon 8 mg Tab  Commonly known as: ROZEREM  take 1 tablet every night by mouth .            CHANGE how you take these medications      donepeziL 10 mg Tab  Commonly known as: ARICEPT  take 1 tablet every night by mouth .  What changed:   how much to take  when to take this  additional instructions     venlafaxine-XR 75 mg Cp24  Commonly known as: EFFEXOR-XR  take 3 capsules with breakfast daily by mouth .  What changed:   how much to take  when to take this  additional instructions            CONTINUE taking these medications      buPROPion XL 300 mg Tb24  Commonly known as: WELLBUTRIN-XL  take 300 mg every day by mouth .     carvediloL 3.125 mg Tab  Commonly known as: COREG  take 3.125 mg in the morning and 3.125 mg before bedtime by mouth.     Jardiance 10 mg Tab  Generic drug: empagliflozin  take 10 mg every day by mouth .     memantine 10 mg Tab  Commonly known as: NAMENDA  take 10 mg in the morning and 10 mg in the evening by mouth. take after meals.     pantoprazole 40 mg Tbec  Commonly known as: PROTONIX  take 40 mg every day by mouth .     raloxifene 60 mg Tab  Commonly known as: EVISTA  take 60 mg by mouth every day.     risperiDONE 0.25 mg Tab  Commonly known as: RisperDAL  take 0.25 mg every day by mouth .     rOPINIRole 1 mg Tab  Commonly known as: REQUIP  take 1 mg by mouth two times daily.     sacubitriL-valsartan 24-26 mg Tab  Commonly known as: ENTRESTO  take 1 tablet in the morning and 1 tablet before bedtime by mouth.     simvastatin 20 mg Tab  Commonly known as:  ZOCOR  take 1 Tab by mouth every night.     spironolactone 25 mg Tab  Commonly known as: ALDACTONE  take 25 mg every day by mouth .     tolterodine ER 4 mg Cp24  Commonly known as: DETROL LA  take 4 mg by mouth every day.     valACYclovir 500 mg Tab  Commonly known as: VALTREX  take 500 mg every day by mouth .     Voltaren Arthritis Pain 1 % Gel  Generic drug: Diclofenac  2 g daily as needed by Topical route  for Pain.            STOP taking these medications      methocarbamoL 500 mg Tab  Commonly known as: ROBAXIN               Where to Get Your Medications        Information about where to get these medications is not yet available    Ask your nurse or doctor about these medications  donepeziL 10 mg Tab  ergocalciferol 1,250 mcg (50,000 unit) Cap  ramelteon 8 mg Tab  venlafaxine-XR 75 mg Cp24       For any Questions or Problems please call your discharging physician, Carlis Abbott, DO, through the [facility] Operator, [phone], or your Primary Care Provider, Emilia Beck P    Condition on Discharge: stable  Discharged to: [facility] ordered? No  Wound care? No  Durable Medical Equipment or Oxygen? No    Recommended or Scheduled Appointments:  Future Appointments   Date Time Provider Department Center   08/20/2023  1:00 PM DX IMAGING [facility] 1ST FLOOR [facility]   08/20/2023  1:30 PM Furrow, Kathreen Cosier, FNP [facility]     Follow up with Neurosurgery in 6wks in the outpatient setting  It is critical that you make your follow-up appointment(s). If you are discharged on the weekend or after business hours, or if we are unable to schedule these appointments for you for any reason, you or a family member need to call during the next business day to schedule your appointment(s).    Activity: ACTIVITY LEVEL: As tolerated.  Diet: cardiac.  Code Status: NO CPR    Carlis Abbott, DO   PGY-1 Internal Medicine    [employer] Contact Information  Internal Medicine  [facility]

## 2023-07-11 NOTE — Progress Notes (Signed)
Discharge paperwork for SNF completed and printed chart.  AVS printed and reviewed with pt's family.  Family to transport pt to LTC in [place].  Primary RN updated.

## 2023-12-07 ENCOUNTER — Ambulatory Visit (INDEPENDENT_AMBULATORY_CARE_PROVIDER_SITE_OTHER): Payer: Self-pay | Admitting: NURSE PRACTITIONER
